# Patient Record
Sex: Male | Born: 1955 | Race: Black or African American | Hispanic: No | Marital: Married | Smoking: Never smoker
Health system: Southern US, Community
[De-identification: ages and names within clinical notes are randomized; demographics above are authoritative.]

## PROBLEM LIST (undated history)

## (undated) DIAGNOSIS — I10 Essential (primary) hypertension: Secondary | ICD-10-CM

## (undated) DIAGNOSIS — I2699 Other pulmonary embolism without acute cor pulmonale: Secondary | ICD-10-CM

## (undated) DIAGNOSIS — I314 Cardiac tamponade: Secondary | ICD-10-CM

## (undated) DIAGNOSIS — E119 Type 2 diabetes mellitus without complications: Secondary | ICD-10-CM

## (undated) HISTORY — PX: ACHILLES TENDON SURGERY: SHX542

## (undated) HISTORY — PX: ACHILLES TENDON REPAIR: SUR1153

---

## 2018-06-24 ENCOUNTER — Emergency Department (HOSPITAL_COMMUNITY): Payer: PRIVATE HEALTH INSURANCE

## 2018-06-24 ENCOUNTER — Other Ambulatory Visit: Payer: Self-pay

## 2018-06-24 ENCOUNTER — Observation Stay (HOSPITAL_COMMUNITY)
Admission: EM | Admit: 2018-06-24 | Discharge: 2018-06-26 | Disposition: A | Discharge status: Home / Self Care | Payer: PRIVATE HEALTH INSURANCE | Attending: Family Medicine | Admitting: Family Medicine

## 2018-06-24 ENCOUNTER — Encounter (HOSPITAL_COMMUNITY): Payer: Self-pay

## 2018-06-24 DIAGNOSIS — Z79899 Other long term (current) drug therapy: Secondary | ICD-10-CM | POA: Insufficient documentation

## 2018-06-24 DIAGNOSIS — I4510 Unspecified right bundle-branch block: Secondary | ICD-10-CM | POA: Insufficient documentation

## 2018-06-24 DIAGNOSIS — E785 Hyperlipidemia, unspecified: Secondary | ICD-10-CM

## 2018-06-24 DIAGNOSIS — Z7984 Long term (current) use of oral hypoglycemic drugs: Secondary | ICD-10-CM | POA: Insufficient documentation

## 2018-06-24 DIAGNOSIS — Z7901 Long term (current) use of anticoagulants: Secondary | ICD-10-CM

## 2018-06-24 DIAGNOSIS — E041 Nontoxic single thyroid nodule: Secondary | ICD-10-CM | POA: Insufficient documentation

## 2018-06-24 DIAGNOSIS — I10 Essential (primary) hypertension: Secondary | ICD-10-CM

## 2018-06-24 DIAGNOSIS — I2699 Other pulmonary embolism without acute cor pulmonale: Secondary | ICD-10-CM | POA: Diagnosis not present

## 2018-06-24 DIAGNOSIS — R42 Dizziness and giddiness: Secondary | ICD-10-CM | POA: Insufficient documentation

## 2018-06-24 DIAGNOSIS — E119 Type 2 diabetes mellitus without complications: Secondary | ICD-10-CM | POA: Diagnosis not present

## 2018-06-24 DIAGNOSIS — R0602 Shortness of breath: Secondary | ICD-10-CM

## 2018-06-24 DIAGNOSIS — Z6835 Body mass index (BMI) 35.0-35.9, adult: Secondary | ICD-10-CM | POA: Insufficient documentation

## 2018-06-24 DIAGNOSIS — K7689 Other specified diseases of liver: Secondary | ICD-10-CM | POA: Insufficient documentation

## 2018-06-24 HISTORY — DX: Essential (primary) hypertension: I10

## 2018-06-24 HISTORY — DX: Type 2 diabetes mellitus without complications: E11.9

## 2018-06-24 LAB — BASIC METABOLIC PANEL
Anion gap: 7 (ref 5–15)
BUN: 13 mg/dL (ref 6–20)
CO2: 25 mmol/L (ref 22–32)
Calcium: 8.7 mg/dL — ABNORMAL LOW (ref 8.9–10.3)
Chloride: 104 mmol/L (ref 98–111)
Creatinine, Ser: 1.04 mg/dL (ref 0.61–1.24)
GFR calc Af Amer: >60 mL/min (ref >60)
GFR calc non Af Amer: >60 mL/min (ref >60)
Glucose, Bld: 132 mg/dL — ABNORMAL HIGH (ref 70–99)
Potassium: 3.8 mmol/L (ref 3.5–5.1)
Sodium: 136 mmol/L (ref 135–145)

## 2018-06-24 LAB — CBC
HCT: 44.1 % (ref 39.0–52.0)
Hemoglobin: 14.3 g/dL (ref 13.0–17.0)
MCH: 27.1 pg (ref 26.0–34.0)
MCHC: 32.4 g/dL (ref 30.0–36.0)
MCV: 83.5 fL (ref 80.0–100.0)
NRBC: 0 % (ref 0.0–0.2)
Platelets: 266 10*3/uL (ref 150–400)
RBC: 5.28 MIL/uL (ref 4.22–5.81)
RDW: 13.9 % (ref 11.5–15.5)
WBC: 4.2 10*3/uL (ref 4.0–10.5)

## 2018-06-24 LAB — D-DIMER, QUANTITATIVE: D-Dimer, Quant: 1.63 ug/mL-FEU — ABNORMAL HIGH (ref 0.00–0.50)

## 2018-06-24 LAB — POCT I-STAT TROPONIN I: Troponin i, poc: 0.01 ng/mL (ref 0.00–0.08)

## 2018-06-24 LAB — APTT: aPTT: 34 seconds (ref 24–36)

## 2018-06-24 LAB — PROTIME-INR
INR: 0.89
Prothrombin Time: 12 seconds (ref 11.4–15.2)

## 2018-06-24 MED ORDER — ONDANSETRON HCL 4 MG/2ML IJ SOLN: 4.0000 mg | Freq: Four times a day (QID) | INTRAMUSCULAR | Status: DC | PRN

## 2018-06-24 MED ORDER — LISINOPRIL 20 MG PO TABS: 40.0000 mg | ORAL_TABLET | Freq: Every day | ORAL | Status: DC

## 2018-06-24 MED ORDER — SODIUM CHLORIDE 0.9 % IV BOLUS
1000.0000 mL | Freq: Once | INTRAVENOUS | Status: AC
  Administered 2018-06-24: 1000 mL via INTRAVENOUS

## 2018-06-24 MED ORDER — ACETAMINOPHEN 650 MG RE SUPP: 650.0000 mg | Freq: Four times a day (QID) | RECTAL | Status: DC | PRN

## 2018-06-24 MED ORDER — IOPAMIDOL (ISOVUE-370) INJECTION 76%
INTRAVENOUS | Status: AC
  Filled 2018-06-24: qty 100

## 2018-06-24 MED ORDER — ATORVASTATIN CALCIUM 20 MG PO TABS: 20.0000 mg | ORAL_TABLET | Freq: Every day | ORAL | Status: DC

## 2018-06-24 MED ORDER — POLYETHYLENE GLYCOL 3350 17 G PO PACK: 17.0000 g | PACK | Freq: Every day | ORAL | Status: DC | PRN

## 2018-06-24 MED ORDER — ONDANSETRON HCL 4 MG PO TABS: 4.0000 mg | ORAL_TABLET | Freq: Four times a day (QID) | ORAL | Status: DC | PRN

## 2018-06-24 MED ORDER — AMLODIPINE BESYLATE 5 MG PO TABS
5.0000 mg | ORAL_TABLET | Freq: Every day | ORAL | Status: DC
  Administered 2018-06-25 – 2018-06-26 (×2): 5 mg via ORAL
  Filled 2018-06-24 (×2): qty 1

## 2018-06-24 MED ORDER — INSULIN ASPART 100 UNIT/ML ~~LOC~~ SOLN
0.0000 [IU] | Freq: Three times a day (TID) | SUBCUTANEOUS | Status: DC
  Administered 2018-06-25: 2 [IU] via SUBCUTANEOUS
  Administered 2018-06-26 (×2): 1 [IU] via SUBCUTANEOUS

## 2018-06-24 MED ORDER — ACETAMINOPHEN 325 MG PO TABS: 650.0000 mg | ORAL_TABLET | Freq: Four times a day (QID) | ORAL | Status: DC | PRN

## 2018-06-24 MED ORDER — HEPARIN (PORCINE) 25000 UT/250ML-% IV SOLN
2000.0000 [IU]/h | INTRAVENOUS | Status: DC
  Administered 2018-06-24 – 2018-06-26 (×4): 2000 [IU]/h via INTRAVENOUS
  Filled 2018-06-24 (×4): qty 250

## 2018-06-24 MED ORDER — IOPAMIDOL (ISOVUE-370) INJECTION 76%
100.0000 mL | Freq: Once | INTRAVENOUS | Status: AC | PRN
  Administered 2018-06-24: 100 mL via INTRAVENOUS

## 2018-06-24 MED ORDER — SODIUM CHLORIDE (PF) 0.9 % IJ SOLN
INTRAMUSCULAR | Status: AC
  Filled 2018-06-24: qty 50

## 2018-06-24 MED ORDER — HEPARIN BOLUS VIA INFUSION
3500.0000 [IU] | INTRAVENOUS | Status: AC
  Administered 2018-06-24: 3500 [IU] via INTRAVENOUS
  Filled 2018-06-24: qty 3500

## 2018-06-24 NOTE — ED Notes (Signed)
Has not taken BP meds since Saturday due to needed refill.

## 2018-06-24 NOTE — Progress Notes (Signed)
ANTICOAGULATION CONSULT NOTE - Initial Consult  Pharmacy Consult for IV heparin Indication: pulmonary embolism  No Known Allergies  Patient Measurements: Height: 6\' 4"  (193 cm) Weight: 290 lb (131.5 kg) IBW/kg (Calculated) : 86.8 Heparin Dosing Weight: 115.4 kg  Vital Signs: Temp: 98.1 F (36.7 C) (01/06 2049) Temp Source: Oral (01/06 2049) BP: 184/101 (01/06 2200) Pulse Rate: 81 (01/06 2200)  Labs: Recent Labs    06/24/18 1519 06/24/18 1646  HGB 14.3  --   HCT 44.1  --   PLT 266  --   APTT  --  34  LABPROT  --  12.0  INR  --  0.89  CREATININE 1.04  --     Estimated Creatinine Clearance: 111.9 mL/min (by C-G formula based on SCr of 1.04 mg/dL).   Medical History: Past Medical History:  Diagnosis Date  . Diabetes mellitus without complication (HCC)   . Hypertension      Assessment: 39 y/oM with PMH of DM, HTN, and recent diagnosis of PNA who presents to [facility] ED on 06/24/2018 with shortness of breath, occasional chest tightness, and lightheadedness. D-dimer 1.63. CTa of chest + for PE. Pharmacy consulted to dose heparin infusion. Patient not on any anticoagulants PTA. Baseline CBC WNL, PT/INR 12/0.89, aPTT 34 seconds.   Goal of Therapy:  Heparin level 0.3-0.7 units/ml Monitor platelets by anticoagulation protocol: Yes   Plan:   Heparin 3500 units IV bolus x 1, then start heparin infusion at 2000 units/hr  Heparin level 6 hours after infusion initiated  Daily CBC and heparin level  Monitor closely for s/sx of bleeding   Greer Pickerel, PharmD, BCPS Pager: [phone] 06/24/2018 10:12 PM

## 2018-06-24 NOTE — ED Provider Notes (Signed)
[facility]-EMERGENCY DEPT Provider Note   CSN: [id number] Arrival date & time: 06/24/18  1421     History   Chief Complaint Chief Complaint  Patient presents with  . Chest Pain    HPI Steven Tapia is a 63 y.o. male.  The history is provided by the patient and medical records. No language interpreter was used.  Shortness of Breath  Severity:  Severe Onset quality:  Gradual Duration:  2 weeks Timing:  Constant Progression:  Waxing and waning Chronicity:  New Relieved by:  Nothing Worsened by:  Nothing Ineffective treatments: antibiotics. Associated symptoms: chest pain (intermittent)   Associated symptoms: no abdominal pain, no cough, no diaphoresis, no fever, no headaches, no neck pain, no sore throat, no vomiting and no wheezing   Risk factors: no hx of PE/DVT   Risk factors comment:  Works as a Naval architecttruck driver   Past Medical History:  Diagnosis Date  . Diabetes mellitus without complication (HCC)   . Hypertension     There are no active problems to display for this patient.   Past Surgical History:  Procedure Laterality Date  . ACHILLES TENDON REPAIR          Home Medications    Prior to Admission medications   Not on File    Family History Family History  Problem Relation Age of Onset  . Alzheimer's disease Mother     Social History Social History   Tobacco Use  . Smoking status: Never Smoker  . Smokeless tobacco: Never Used  Substance Use Topics  . Alcohol use: Never    Frequency: Never  . Drug use: Never     Allergies   Patient has no known allergies.   Review of Systems Review of Systems  Constitutional: Negative for chills, diaphoresis, fatigue and fever.  HENT: Negative for congestion and sore throat.   Respiratory: Positive for chest tightness and shortness of breath. Negative for cough, wheezing and stridor.   Cardiovascular: Positive for chest pain (intermittent). Negative for palpitations and leg  swelling.  Gastrointestinal: Negative for abdominal pain, constipation, diarrhea, nausea and vomiting.  Genitourinary: Negative for flank pain.  Musculoskeletal: Negative for back pain, neck pain and neck stiffness.  Neurological: Negative for light-headedness and headaches.  Psychiatric/Behavioral: Negative for agitation.  All other systems reviewed and are negative.    Physical Exam Updated Vital Signs BP (!) 182/108   Pulse (!) 108   Temp 98.5 F (36.9 C) (Oral)   Resp 20   Ht 6\' 4"  (1.93 m)   Wt 131.5 kg   SpO2 99%   BMI 35.30 kg/m   Physical Exam Vitals signs and nursing note reviewed.  Constitutional:      General: He is not in acute distress.    Appearance: He is well-developed. He is not ill-appearing, toxic-appearing or diaphoretic.  HENT:     Head: Normocephalic and atraumatic.  Eyes:     Conjunctiva/sclera: Conjunctivae normal.     Pupils: Pupils are equal, round, and reactive to light.  Neck:     Musculoskeletal: Neck supple.  Cardiovascular:     Rate and Rhythm: Regular rhythm. Tachycardia present.     Heart sounds: No murmur. No systolic murmur.  Pulmonary:     Effort: Pulmonary effort is normal. Tachypnea present. No respiratory distress.     Breath sounds: Normal breath sounds.  Abdominal:     Palpations: Abdomen is soft.     Tenderness: There is no abdominal tenderness.  Musculoskeletal:  Right lower leg: He exhibits no tenderness. No edema.     Left lower leg: He exhibits no tenderness. No edema.  Skin:    General: Skin is warm and dry.     Capillary Refill: Capillary refill takes less than 2 seconds.  Neurological:     General: No focal deficit present.     Mental Status: He is alert.      ED Treatments / Results  Labs (all labs ordered are listed, but only abnormal results are displayed) Labs Reviewed  BASIC METABOLIC PANEL - Abnormal; Notable for the following components:      Result Value   Glucose, Bld 132 (*)    Calcium 8.7  (*)    All other components within normal limits  D-DIMER, QUANTITATIVE (NOT AT [facility]) - Abnormal; Notable for the following components:   D-Dimer, Quant 1.63 (*)    All other components within normal limits  CBC  PROTIME-INR  APTT  HEPARIN LEVEL (UNFRACTIONATED)  CBC  HIV ANTIBODY (ROUTINE TESTING W REFLEX)  HEMOGLOBIN A1C  I-STAT TROPONIN, ED  POCT I-STAT TROPONIN I    EKG EKG Interpretation  Date/Time:  Monday June 24 2018 14:38:58 EST Ventricular Rate:  107 PR Interval:    QRS Duration: 135 QT Interval:  343 QTC Calculation: 458 R Axis:   41 Text Interpretation:  Sinus tachycardia Probable left atrial enlargement Right bundle branch block Lateral leads are also involved Baseline wander in lead(s) I III aVL No prior ECG for comparison, RBBB present with t wave inversions.  No STEMI Confirmed by Theda Belfast ([id number]) on 06/24/2018 4:40:36 PM   Radiology Dg Chest 2 View  Result Date: 06/24/2018 CLINICAL DATA:  63 y.o male states he was seen in an ED in [facility] on 12/24 and was told he had pneumonia and was given oral antibiotics. Patient states he began having intermittent chest pain, intermittent SOB EXAM: CHEST - 2 VIEW COMPARISON:  None. FINDINGS: There is hazy right basilar airspace disease which may reflect atelectasis versus pneumonia. There is no focal parenchymal opacity. There is no pleural effusion or pneumothorax. The heart size is top-normal. The osseous structures are unremarkable. IMPRESSION: Hazy right basilar airspace disease which may reflect atelectasis versus pneumonia. Electronically Signed   By: Elige Ko   On: 06/24/2018 16:02   Ct Angio Chest Pe W And/or Wo Contrast  Result Date: 06/24/2018 CLINICAL DATA:  PE suspected, intermediate prob, positive D-dimer. Patient reports being diagnosed with pneumonia at an outside facility 2 weeks ago. Onset of chest pain and shortness of breath last night. EXAM: CT ANGIOGRAPHY CHEST WITH CONTRAST TECHNIQUE: Multidetector CT  imaging of the chest was performed using the standard protocol during bolus administration of intravenous contrast. Multiplanar CT image reconstructions and MIPs were obtained to evaluate the vascular anatomy. CONTRAST:  ISOVUE-370 IOPAMIDOL (ISOVUE-370) INJECTION 76% COMPARISON:  Radiographs earlier this day. FINDINGS: Cardiovascular: Positive for acute pulmonary embolus with filling defects in the segmental branches of the right lower lobe. Possible additional subsegmental filling defects in the left lower lobe, not well assessed due to contrast bolus timing and soft tissue attenuation from habitus. There is multi chamber cardiomegaly with RV to LV ratio of 1. The thoracic aorta is normal in caliber. No aortic dissection. No pericardial effusion. Mediastinum/Nodes: Small mediastinal lymph nodes are not enlarged by size criteria. The esophagus is decompressed. Mild thyroid enlargement with probable 2.4 cm nodule in the left lobe, partially obscured by dense streak artifact in the subclavian vein. Lungs/Pleura:  Minor dependent atelectasis, right greater than left. No evidence of pulmonary infarct. No pulmonary edema or pleural effusion. Trachea and mainstem bronchi are patent. Upper Abdomen: Incidental cyst in the periphery of the left lobe of the liver. No acute or suspicious findings. Musculoskeletal: Mild degenerative change in the thoracic spine. There are no acute or suspicious osseous abnormalities. Review of the MIP images confirms the above findings. IMPRESSION: 1. Positive for acute pulmonary embolus with filling defects in the segmental branches of the right lower lobe pulmonary artery, and possibly subsegmental left lower lobe. Thromboembolic burden is small to moderate. 2. Multi chamber cardiomegaly. While the RV to LV ratio is 1 in suggest presence of mild right heart strain, do not recommend Code PE activation given emboli involve the segmental rather than lobar or more proximal branches. 3. **An  incidental finding of potential clinical significance has been found. Probable 2.4 cm left thyroid nodule. Recommend nonemergent thyroid ultrasound for characterization.** Critical Value/emergent results were called by telephone at the time of interpretation on 06/24/2018 at 8:55 pm to Dr. Lynden OxfordHRISTOPHER Tarnesha Ulloa , who verbally acknowledged these results. Electronically Signed   By: Narda RutherfordMelanie  Sanford M.D.   On: 06/24/2018 20:56    Procedures Procedures (including critical care time)  CRITICAL CARE Performed by: Canary Brimhristopher J Barack Nicodemus Total critical care time: 35 minutes Critical care time was exclusive of separately billable procedures and treating other patients. Pulmonary embolism with heart strain requiring heparin and admission. Critical care was necessary to treat or prevent imminent or life-threatening deterioration. Critical care was time spent personally by me on the following activities: development of treatment plan with patient and/or surrogate as well as nursing, discussions with consultants, evaluation of patient's response to treatment, examination of patient, obtaining history from patient or surrogate, ordering and performing treatments and interventions, ordering and review of laboratory studies, ordering and review of radiographic studies, pulse oximetry and re-evaluation of patient's condition.   Medications Ordered in ED Medications  sodium chloride (PF) 0.9 % injection (has no administration in time range)  iopamidol (ISOVUE-370) 76 % injection (has no administration in time range)  heparin bolus via infusion 3,500 Units (3,500 Units Intravenous Bolus from Bag 06/24/18 2154)    Followed by  heparin ADULT infusion 100 units/mL (25000 units/21450mL sodium chloride 0.45%) (2,000 Units/hr Intravenous New Bag/Given 06/24/18 2156)  lisinopril (PRINIVIL,ZESTRIL) tablet 40 mg (has no administration in time range)  atorvastatin (LIPITOR) tablet 20 mg (has no administration in time range)    amLODipine (NORVASC) tablet 5 mg (has no administration in time range)  acetaminophen (TYLENOL) tablet 650 mg (has no administration in time range)    Or  acetaminophen (TYLENOL) suppository 650 mg (has no administration in time range)  polyethylene glycol (MIRALAX / GLYCOLAX) packet 17 g (has no administration in time range)  ondansetron (ZOFRAN) tablet 4 mg (has no administration in time range)    Or  ondansetron (ZOFRAN) injection 4 mg (has no administration in time range)  insulin aspart (novoLOG) injection 0-9 Units (has no administration in time range)  hydrALAZINE (APRESOLINE) injection 10 mg (has no administration in time range)  sodium chloride 0.9 % bolus 1,000 mL (1,000 mLs Intravenous New Bag/Given (Non-Interop) 06/24/18 1647)  iopamidol (ISOVUE-370) 76 % injection 100 mL (100 mLs Intravenous Contrast Given 06/24/18 2043)     Initial Impression / Assessment and Plan / ED Course  I have reviewed the triage vital signs and the nursing notes.  Pertinent labs & imaging results that were available during my care  of the patient were reviewed by me and considered in my medical decision making (see chart for details).     Steven Tapia is a 63 y.o. male with a past medical history significant for hypertension diabetes as well as recent diagnosis of pneumonia currently on antibiotics who presents with continued shortness of breath and lightheadedness.  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 and says that they were Christmas, approximately 2 weeks ago, he had shortness of breath.  He was told that x-ray showed pneumonia despite lack of fevers, chills, congestion, or significant cough.  He has been taken the antibiotics and has had worsening of his shortness of breath.  He denies chest pressure but does report occasional chest tightness.  No significant chest pain at this time.  He does report feeling lightheaded and fatigued but  denies any nausea, vomiting, diaphoresis, urinary symptoms or GI symptoms.  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  He denies other complaints.  No recent injuries.  On arrival, patient is tachycardic with a rate of 108.  Patient is slightly tachypneic and has elevated blood pressure.  Patient is on room air resting comfortably however.  On exam, patient's lungs had faint rhonchi in the bases but otherwise had no significant abnormalities.  Chest and back were nontender.  No flank tenderness.  Legs were nonedematous and nontender.  No focal neurologic deficit seen on initial exam.  Radial pulses were symmetric bilaterally.  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  Patient will have a d-dimer to rule this out.  He will have a repeat chest x-ray to look for continued pneumonia and other lab testing and cardiac work-up.  Anticipate reassessment after work-up.  6:09 PM Heart rate began to improve with some fluids.  Initial troponin was negative.  D-dimer was however elevated at 1.63, CT PE study will be ordered.  No leukocytosis or anemia.  Metabolic panel reassuring aside from mild hypocalcemia.  Chest x-ray shows hazy atelectasis versus mild pneumonia.  CT scan will help determine pulmonary embolism versus actual residual pneumonia.  Patient resting comfortably at this time.   9:17 PM PE study revealed pulmonary embolism in the segmental branches with some right heart strain.  Given the right heart strain, patient will be admitted for heparin and further monitoring.   Heparin will be ordered and patient will be admitted for further management.    Final Clinical Impressions(s) / ED Diagnoses   Final diagnoses:  Shortness  of breath  Lightheadedness  Other acute pulmonary embolism, unspecified whether acute cor pulmonale present [facility])    ED Discharge Orders    None     Clinical Impression: 1. Shortness of breath   2. Lightheadedness   3. Other acute pulmonary embolism, unspecified whether acute cor pulmonale present (HCC)     Disposition: Admit  This note was prepared with assistance of Dragon voice recognition software. Occasional wrong-word or sound-a-like substitutions may have occurred due to the inherent limitations of voice recognition software.     Markan Cazarez, Canary Brim, MD 06/25/18 [phone]

## 2018-06-24 NOTE — Progress Notes (Signed)
ED TO INPATIENT HANDOFF REPORT  Name/Age/Gender Steven Tapia 63 y.o. male  Code Status   Home/SNF/Other Home  Chief Complaint Chest Pain  Level of Care/Admitting Diagnosis ED Disposition    ED Disposition Condition Comment   Admit  Hospital Area: [facility] [[id number]]  Level of Care: Telemetry [5]  Admit to tele based on following criteria: Other see comments  Comments: PE  Diagnosis: PE (pulmonary thromboembolism) [facility]) [703500]  Admitting Physician: Synetta Fail [[id number]]  Attending Physician: Synetta Fail [[id number]]  PT Class (Do Not Modify): Observation [104]  PT Acc Code (Do Not Modify): Observation [10022]       Medical History Past Medical History:  Diagnosis Date  . Diabetes mellitus without complication (HCC)   . Hypertension     Allergies No Known Allergies  IV Location/Drains/Wounds Patient Lines/Drains/Airways Status   Active Line/Drains/Airways    Name:   Placement date:   Placement time:   Site:   Days:   Peripheral IV 06/24/18 Left Antecubital   06/24/18    1646    Antecubital   less than 1          Labs/Imaging Results for orders placed or performed during the hospital encounter of 06/24/18 (from the past 48 hour(s))  Basic metabolic panel     Status: Abnormal   Collection Time: 06/24/18  3:19 PM  Result Value Ref Range   Sodium 136 135 - 145 mmol/L   Potassium 3.8 3.5 - 5.1 mmol/L   Chloride 104 98 - 111 mmol/L   CO2 25 22 - 32 mmol/L   Glucose, Bld 132 (H) 70 - 99 mg/dL   BUN 13 6 - 20 mg/dL   Creatinine, Ser 7.89 0.61 - 1.24 mg/dL   Calcium 8.7 (L) 8.9 - 10.3 mg/dL   GFR calc non Af Amer >60 >60 mL/min   GFR calc Af Amer >60 >60 mL/min   Anion gap 7 5 - 15    Comment: Performed at [facility], [address], [place], [place] [address]  CBC     Status: None   Collection Time: 06/24/18  3:19 PM  Result Value Ref Range   WBC 4.2 4.0 - 10.5 K/uL   RBC 5.28 4.22 - 5.81 MIL/uL    Hemoglobin 14.3 13.0 - 17.0 g/dL   HCT 75.1 02.5 - 85.2 %   MCV 83.5 80.0 - 100.0 fL   MCH 27.1 26.0 - 34.0 pg   MCHC 32.4 30.0 - 36.0 g/dL   RDW 77.8 24.2 - 35.3 %   Platelets 266 150 - 400 K/uL   nRBC 0.0 0.0 - 0.2 %    Comment: Performed at [facility], [address], [place], [place] [address]  POCT i-Stat troponin I     Status: None   Collection Time: 06/24/18  3:26 PM  Result Value Ref Range   Troponin i, poc 0.01 0.00 - 0.08 ng/mL   Comment 3            Comment: Due to the release kinetics of cTnI, a negative result within the first hours of the onset of symptoms does not rule out myocardial infarction with certainty. If myocardial infarction is still suspected, repeat the test at appropriate intervals.   D-dimer, quantitative (not at [facility])     Status: Abnormal   Collection Time: 06/24/18  4:46 PM  Result Value Ref Range   D-Dimer, Quant 1.63 (H) 0.00 - 0.50 ug/mL-FEU    Comment: (NOTE)  At the manufacturer cut-off of 0.50 ug/mL FEU, this assay has been documented to exclude PE with a sensitivity and negative predictive value of 97 to 99%.  At this time, this assay has not been approved by the FDA to exclude DVT/VTE. Results should be correlated with clinical presentation. Performed at [facility], [address] [place], [place] [address]   Protime-INR     Status: None   Collection Time: 06/24/18  4:46 PM  Result Value Ref Range   Prothrombin Time 12.0 11.4 - 15.2 seconds   INR 0.89     Comment: Performed at [facility], [address] [place], [place] [address]  APTT     Status: None   Collection Time: 06/24/18  4:46 PM  Result Value Ref Range   aPTT 34 24 - 36 seconds    Comment: Performed at [facility], [address] [place], [place] [address]   Dg Chest 2 View  Result Date: 06/24/2018 CLINICAL DATA:  63 y.o male states [facility] and was told he  had pneumonia and was given oral antibiotics. Patient states he began having intermittent chest pain, intermittent SOB EXAM: CHEST - 2 VIEW COMPARISON:  None. FINDINGS: There is hazy right basilar airspace disease which may reflect atelectasis versus pneumonia. There is no focal parenchymal opacity. There is no pleural effusion or pneumothorax. The heart size is top-normal. The osseous structures are unremarkable. IMPRESSION: Hazy right basilar airspace disease which may reflect atelectasis versus pneumonia. Electronically Signed   By: Elige KoHetal  Patel   On: 06/24/2018 16:02   Ct Angio Chest Pe W And/or Wo Contrast  Result Date: 06/24/2018 CLINICAL DATA:  PE suspected, intermediate prob, positive D-dimer. Patient reports being diagnosed with pneumonia at an outside facility 2 weeks ago. Onset of chest pain and shortness of breath last night. EXAM: CT ANGIOGRAPHY CHEST WITH CONTRAST TECHNIQUE: Multidetector CT imaging of the chest was performed using the standard protocol during bolus administration of intravenous contrast. Multiplanar CT image reconstructions and MIPs were obtained to evaluate the vascular anatomy. CONTRAST:  100mL ISOVUE-370 IOPAMIDOL (ISOVUE-370) INJECTION 76% COMPARISON:  Radiographs earlier this day. FINDINGS: Cardiovascular: Positive for acute pulmonary embolus with filling defects in the segmental branches of the right lower lobe. Possible additional subsegmental filling defects in the left lower lobe, not well assessed due to contrast bolus timing and soft tissue attenuation from habitus. There is multi chamber cardiomegaly with RV to LV ratio of 1. The thoracic aorta is normal in caliber. No aortic dissection. No pericardial effusion. Mediastinum/Nodes: Small mediastinal lymph nodes are not enlarged by size criteria. The esophagus is decompressed. Mild thyroid enlargement with probable 2.4 cm nodule in the left lobe, partially obscured by dense streak artifact in the subclavian vein.  Lungs/Pleura: Minor dependent atelectasis, right greater than left. No evidence of pulmonary infarct. No pulmonary edema or pleural effusion. Trachea and mainstem bronchi are patent. Upper Abdomen: Incidental cyst in the periphery of the left lobe of the liver. No acute or suspicious findings. Musculoskeletal: Mild degenerative change in the thoracic spine. There are no acute or suspicious osseous abnormalities. Review of the MIP images confirms the above findings. IMPRESSION: 1. Positive for acute pulmonary embolus with filling defects in the segmental branches of the right lower lobe pulmonary artery, and possibly subsegmental left lower lobe. Thromboembolic burden is small to moderate. 2. Multi chamber cardiomegaly. While the RV to LV ratio is 1 in  suggest presence of mild right heart strain, do not recommend Code PE activation given emboli involve the segmental rather than lobar or more proximal branches. 3. **An incidental finding of potential clinical significance has been found. Probable 2.4 cm left thyroid nodule. Recommend nonemergent thyroid ultrasound for characterization.** Critical Value/emergent results were called by telephone at the time of interpretation on 06/24/2018 at 8:55 pm to Dr. Lynden Oxford , who verbally acknowledged these results. Electronically Signed   By: Narda Rutherford M.D.   On: 06/24/2018 20:56    Pending Labs Unresulted Labs (From admission, onward)    Start     Ordered   06/26/18 0500  Heparin level (unfractionated)  Daily,   R     06/24/18 2216   06/25/18 0500  CBC  Daily,   R     06/24/18 2216   06/25/18 0400  Heparin level (unfractionated)  Once-Timed,   R     06/24/18 2216   Signed and Held  HIV antibody (Routine Testing)  Once,   R     Signed and Held   Signed and Held  Hemoglobin A1c  Once,   R    Comments:  To assess prior glycemic control    Signed and Held          Vitals/Pain Today's Vitals   06/24/18 2049 06/24/18 2100 06/24/18 2130  06/24/18 2200  BP: (!) 178/98 (!) 182/98 (!) 185/96 (!) 184/101  Pulse: 82 76 81 81  Resp: (!) 24 (!) 25 16 (!) 30  Temp: 98.1 F (36.7 C)     TempSrc: Oral     SpO2: 99% 97% 98% 98%  Weight:      Height:      PainSc:        Isolation Precautions No active isolations  Medications Medications  sodium chloride (PF) 0.9 % injection (has no administration in time range)  iopamidol (ISOVUE-370) 76 % injection (has no administration in time range)  heparin bolus via infusion 3,500 Units (3,500 Units Intravenous Bolus from Bag 06/24/18 2154)    Followed by  heparin ADULT infusion 100 units/mL (25000 units/289mL sodium chloride 0.45%) (2,000 Units/hr Intravenous New Bag/Given 06/24/18 2156)  sodium chloride 0.9 % bolus 1,000 mL (1,000 mLs Intravenous New Bag/Given (Non-Interop) 06/24/18 1647)  iopamidol (ISOVUE-370) 76 % injection 100 mL (100 mLs Intravenous Contrast Given 06/24/18 2043)    Mobility walks

## 2018-06-24 NOTE — ED Triage Notes (Addendum)
Patient states he was seen in an ED in [facility] on 12/24 and was told he had pneumonia and was given oral antibiotics. Patient states he began having intermittent chest pain, intermittent SOB, and feeling light headed since last night. Patient denies having a cough.

## 2018-06-25 ENCOUNTER — Observation Stay (HOSPITAL_BASED_OUTPATIENT_CLINIC_OR_DEPARTMENT_OTHER): Payer: PRIVATE HEALTH INSURANCE

## 2018-06-25 ENCOUNTER — Encounter (HOSPITAL_COMMUNITY): Payer: Self-pay | Admitting: Internal Medicine

## 2018-06-25 DIAGNOSIS — I2699 Other pulmonary embolism without acute cor pulmonale: Secondary | ICD-10-CM

## 2018-06-25 LAB — GLUCOSE, CAPILLARY
Glucose-Capillary: 107 mg/dL — ABNORMAL HIGH (ref 70–99)
Glucose-Capillary: 175 mg/dL — ABNORMAL HIGH (ref 70–99)
Glucose-Capillary: 117 mg/dL — ABNORMAL HIGH (ref 70–99)
Glucose-Capillary: 133 mg/dL — ABNORMAL HIGH (ref 70–99)

## 2018-06-25 LAB — ECHOCARDIOGRAM COMPLETE
Height: 76 in
Weight: 4640 oz

## 2018-06-25 LAB — CBC
HCT: 41.9 % (ref 39.0–52.0)
Hemoglobin: 13.6 g/dL (ref 13.0–17.0)
MCH: 27.1 pg (ref 26.0–34.0)
MCHC: 32.5 g/dL (ref 30.0–36.0)
MCV: 83.5 fL (ref 80.0–100.0)
Platelets: 247 10*3/uL (ref 150–400)
RBC: 5.02 MIL/uL (ref 4.22–5.81)
RDW: 14.1 % (ref 11.5–15.5)
WBC: 4.9 10*3/uL (ref 4.0–10.5)
nRBC: 0 % (ref 0.0–0.2)

## 2018-06-25 LAB — HEPARIN LEVEL (UNFRACTIONATED)
Heparin Unfractionated: 0.39 IU/mL (ref 0.30–0.70)
Heparin Unfractionated: 0.4 [IU]/mL (ref 0.30–0.70)

## 2018-06-25 LAB — HIV ANTIBODY (ROUTINE TESTING W REFLEX): HIV Screen 4th Generation wRfx: NONREACTIVE

## 2018-06-25 LAB — HEMOGLOBIN A1C
Hgb A1c MFr Bld: 6.5 % — ABNORMAL HIGH (ref 4.8–5.6)
Mean Plasma Glucose: 139.85 mg/dL

## 2018-06-25 MED ORDER — ATORVASTATIN CALCIUM 20 MG PO TABS
20.0000 mg | ORAL_TABLET | Freq: Every day | ORAL | Status: DC
  Administered 2018-06-25: 20 mg via ORAL
  Filled 2018-06-25: qty 1

## 2018-06-25 MED ORDER — HYDRALAZINE HCL 20 MG/ML IJ SOLN
10.0000 mg | Freq: Once | INTRAMUSCULAR | Status: AC
  Administered 2018-06-25: 10 mg via INTRAVENOUS
  Filled 2018-06-25: qty 1

## 2018-06-25 MED ORDER — HYDROCHLOROTHIAZIDE 25 MG PO TABS
25.0000 mg | ORAL_TABLET | Freq: Every day | ORAL | Status: DC
  Administered 2018-06-25 – 2018-06-26 (×2): 25 mg via ORAL
  Filled 2018-06-25 (×2): qty 1

## 2018-06-25 MED ORDER — LISINOPRIL 20 MG PO TABS
40.0000 mg | ORAL_TABLET | Freq: Every day | ORAL | Status: DC
  Administered 2018-06-25 – 2018-06-26 (×2): 40 mg via ORAL
  Filled 2018-06-25 (×2): qty 2

## 2018-06-25 MED ORDER — ORAL CARE MOUTH RINSE
15.0000 mL | Freq: Two times a day (BID) | OROMUCOSAL | Status: DC
  Administered 2018-06-26: 15 mL via OROMUCOSAL

## 2018-06-25 MED ORDER — CHLORHEXIDINE GLUCONATE 0.12 % MT SOLN
15.0000 mL | Freq: Two times a day (BID) | OROMUCOSAL | Status: DC
  Administered 2018-06-25 – 2018-06-26 (×3): 15 mL via OROMUCOSAL
  Filled 2018-06-25 (×4): qty 15

## 2018-06-25 MED ORDER — PERFLUTREN LIPID MICROSPHERE
1.0000 mL | INTRAVENOUS | Status: AC | PRN
  Administered 2018-06-25: 3 mL via INTRAVENOUS
  Filled 2018-06-25: qty 10

## 2018-06-25 NOTE — Progress Notes (Signed)
Pharmacy: Re- heparin  Patient's a 63 y.o M currently on heparin drip for acute PE.  First heparin level now (goal 0.3-0.7) therapeutic at 0.39.  Plan: - contninue heparin drip at 2000 units/hr - recheck another 6 hr heparin level to confirm level is still therapeutic before changing to daily monitoring - monitor for s/s bleeding  Dorna Leitz, PharmD, BCPS 06/25/2018 4:57 AM

## 2018-06-25 NOTE — H&P (Addendum)
H&P        History and Physical    Steven Tapia JOI:[id number] DOB: September 02, 1955 DOA: 06/24/2018  PCP: System, Pcp Not In  Patient coming from: home  I have personally briefly reviewed patient's old medical records in [facility]  Chief Complaint: chest tightness  HPI: Steven Tapia is a 63 y.o. male with medical history significant of Dm, Htn presents with chest tightness.  Patient was diagnosed with pneumonia in December at a facility in [place].  He completed his antibiotics for 10 days.  He continued to have chest tightness which prompted him to come to the ED today.  Denies any leg pain or swelling.  He denies any personal history of heart, lung or blood clot history.  His father did have blood clots in his elderly years.  Denies any shortness of breath or fever.  He has diabetes maintained with metformin.  Also has hypertension hyperlipidemia.  He is a Agricultural consultant.   ED Course: Patient had a mildly elevated d-dimer with a subsequent CTA chest that showed pulmonary embolism with mild  right heart strain.  Patient was quite Hypertensive here.  He however has not taken his night medications.  Patient was started on heparin drip and given a dose of IV hydralazine for his blood pressure.  Review of Systems: Denies shortness of breath fever leg pain or swelling  All others reviewed with patient  and are  negative unless otherwise stated that  Past Medical History:  Diagnosis Date  . Diabetes mellitus without complication (HCC)   . Hypertension Hyperlipidemia     Past Surgical History:  Procedure Laterality Date  . ACHILLES TENDON REPAIR    . ACHILLES TENDON SURGERY       reports that he has never smoked. He has never used smokeless tobacco. He reports that he does not drink alcohol or use drugs.  No Known Allergies  Family History  Problem Relation Age of Onset  . Alzheimer's disease Mother   . Clotting disorder Father   . Diabetes Sister     Prior  to Admission medications   Medication Sig Start Date End Date Taking? Authorizing Provider  amLODipine (NORVASC) 5 MG tablet Take 5 mg by mouth daily.   Yes [provider]  atorvastatin (LIPITOR) 20 MG tablet Take 20 mg by mouth daily.   Yes [provider]  lisinopril (PRINIVIL,ZESTRIL) 40 MG tablet Take 40 mg by mouth daily.   Yes [provider]  metFORMIN (GLUCOPHAGE) 500 MG tablet Take 500 mg by mouth 2 (two) times daily with a meal.   Yes [provider]    Physical Exam: Vitals:   06/24/18 2200 06/24/18 2230 06/24/18 2329 06/25/18 0115  BP: (!) 184/101 (!) 190/106 (!) 201/103 (!) 192/92  Pulse: 81 82 82   Resp: (!) 30 (!) 27 20   Temp:   97.7 F (36.5 C)   TempSrc:   Oral   SpO2: 98% 97% 97%   Weight:      Height:        Constitutional: NAD, calm, comfortable Vitals:   06/24/18 2200 06/24/18 2230 06/24/18 2329 06/25/18 0115  BP: (!) 184/101 (!) 190/106 (!) 201/103 (!) 192/92  Pulse: 81 82 82   Resp: (!) 30 (!) 27 20   Temp:   97.7 F (36.5 C)   TempSrc:   Oral   SpO2: 98% 97% 97%   Weight:      Height:  Eyes: PERRL, lids and conjunctivae normal ENMT: Mucous membranes are moist. Posterior pharynx clear of any exudate or lesions.Normal dentition.  Neck: normal, supple,  Respiratory: clear to auscultation bilaterally, no wheezing, no crackles. Normal respiratory effort. No accessory muscle use.  Cardiovascular: Regular rate and rhythm, +gallops. No extremity edema. 2+ pedal pulses.   Abdomen: no tenderness, no masses palpated.Bowel sounds positive.  Musculoskeletal: no clubbing / cyanosis.  Skin: no rashes, lesions, ulcers. No induration Neurologic: CN 2-12 grossly intact. Strength 5/5 in all 4.  Psychiatric: Normal judgment and insight. Alert and oriented x 3. Normal mood.    Labs on Admission: I have personally reviewed following labs and imaging studies  CBC: Recent Labs  Lab 06/24/18 1519  WBC 4.2  HGB 14.3  HCT  44.1  MCV 83.5  PLT 266   Basic Metabolic Panel: Recent Labs  Lab 06/24/18 1519  NA 136  K 3.8  CL 104  CO2 25  GLUCOSE 132*  BUN 13  CREATININE 1.04  CALCIUM 8.7*   GFR: Estimated Creatinine Clearance: 111.9 mL/min (by C-G formula based on SCr of 1.04 mg/dL). Liver Function Tests: No results for input(s): AST, ALT, ALKPHOS, BILITOT, PROT, ALBUMIN in the last 168 hours. No results for input(s): LIPASE, AMYLASE in the last 168 hours. No results for input(s): AMMONIA in the last 168 hours. Coagulation Profile: Recent Labs  Lab 06/24/18 1646  INR 0.89   Cardiac Enzymes: No results for input(s): CKTOTAL, CKMB, CKMBINDEX, TROPONINI in the last 168 hours. BNP (last 3 results) No results for input(s): PROBNP in the last 8760 hours. HbA1C: No results for input(s): HGBA1C in the last 72 hours. CBG: No results for input(s): GLUCAP in the last 168 hours. Lipid Profile: No results for input(s): CHOL, HDL, LDLCALC, TRIG, CHOLHDL, LDLDIRECT in the last 72 hours. Thyroid Function Tests: No results for input(s): TSH, T4TOTAL, FREET4, T3FREE, THYROIDAB in the last 72 hours. Anemia Panel: No results for input(s): VITAMINB12, FOLATE, FERRITIN, TIBC, IRON, RETICCTPCT in the last 72 hours. Urine analysis: No results found for: COLORURINE, APPEARANCEUR, LABSPEC, PHURINE, GLUCOSEU, HGBUR, BILIRUBINUR, KETONESUR, PROTEINUR, UROBILINOGEN, NITRITE, LEUKOCYTESUR  Radiological Exams on Admission: Dg Chest 2 View  Result Date: 06/24/2018 CLINICAL DATA:  63 y.o male states [facility] and was told he had pneumonia and was given oral antibiotics. Patient states he began having intermittent chest pain, intermittent SOB EXAM: CHEST - 2 VIEW COMPARISON:  None. FINDINGS: There is hazy right basilar airspace disease which may reflect atelectasis versus pneumonia. There is no focal parenchymal opacity. There is no pleural effusion or pneumothorax. The heart size is top-normal. The  osseous structures are unremarkable. IMPRESSION: Hazy right basilar airspace disease which may reflect atelectasis versus pneumonia. Electronically Signed   By: Elige KoHetal  Patel   On: 06/24/2018 16:02   Ct Angio Chest Pe W And/or Wo Contrast  Result Date: 06/24/2018 CLINICAL DATA:  PE suspected, intermediate prob, positive D-dimer. Patient reports being diagnosed with pneumonia at an outside facility 2 weeks ago. Onset of chest pain and shortness of breath last night. EXAM: CT ANGIOGRAPHY CHEST WITH CONTRAST TECHNIQUE: Multidetector CT imaging of the chest was performed using the standard protocol during bolus administration of intravenous contrast. Multiplanar CT image reconstructions and MIPs were obtained to evaluate the vascular anatomy. CONTRAST:  100mL ISOVUE-370 IOPAMIDOL (ISOVUE-370) INJECTION 76% COMPARISON:  Radiographs earlier this day. FINDINGS: Cardiovascular: Positive for acute pulmonary embolus with filling defects in the segmental branches of the  right lower lobe. Possible additional subsegmental filling defects in the left lower lobe, not well assessed due to contrast bolus timing and soft tissue attenuation from habitus. There is multi chamber cardiomegaly with RV to LV ratio of 1. The thoracic aorta is normal in caliber. No aortic dissection. No pericardial effusion. Mediastinum/Nodes: Small mediastinal lymph nodes are not enlarged by size criteria. The esophagus is decompressed. Mild thyroid enlargement with probable 2.4 cm nodule in the left lobe, partially obscured by dense streak artifact in the subclavian vein. Lungs/Pleura: Minor dependent atelectasis, right greater than left. No evidence of pulmonary infarct. No pulmonary edema or pleural effusion. Trachea and mainstem bronchi are patent. Upper Abdomen: Incidental cyst in the periphery of the left lobe of the liver. No acute or suspicious findings. Musculoskeletal: Mild degenerative change in the thoracic spine. There are no acute or  suspicious osseous abnormalities. Review of the MIP images confirms the above findings. IMPRESSION: 1. Positive for acute pulmonary embolus with filling defects in the segmental branches of the right lower lobe pulmonary artery, and possibly subsegmental left lower lobe. Thromboembolic burden is small to moderate. 2. Multi chamber cardiomegaly. While the RV to LV ratio is 1 in suggest presence of mild right heart strain, do not recommend Code PE activation given emboli involve the segmental rather than lobar or more proximal branches. 3. **An incidental finding of potential clinical significance has been found. Probable 2.4 cm left thyroid nodule. Recommend nonemergent thyroid ultrasound for characterization.** Critical Value/emergent results were called by telephone at the time of interpretation on 06/24/2018 at 8:55 pm to Dr. Lynden Oxford , who verbally acknowledged these results. Electronically Signed   By: Narda Rutherford M.D.   On: 06/24/2018 20:56    EKG: Independently reviewed. *Sinus rhythm right bundle branch block  Assessment/Plan Principal Problem:   PE (pulmonary thromboembolism) (HCC) Active Problems:   HTN (hypertension)   DM2 (diabetes mellitus, type 2) (HCC)   Hyperlipidemia Incidental thyroid nodule seen on imaging  -Inpatient admission to telemetry bed.  IV heparin.  Check echocardiogram as patient does have a gallop on exam as well as cardiomegaly and right heart strain on chest CT. -Carb consistent diet, sliding scale insulin , check hemoglobin A1c, hold metformin -Continue home meds home meds for hypertension and hyperlipidemia -Needs nonemergent thyroid ultrasound to follow-up incidental nodule   DVT prophylaxis: On heparin drip  code Status: Full Family Communication: Wife at bedside Disposition Plan: Home 1 to 2 days with follow-up with PCP.  Needs to find a PCP in this area.  Admission status: Inpatient telemetry It is my clinical opinion that admission to  INPATIENT is reasonable and necessary because of the expectation that this patient will require hospital care that crosses at least 2 midnights to treat this condition based on the medical complexity of the problems presented.    Synetta Fail MD [facility] Pager [phone]  If 7PM-7AM, please contact night-coverage [email or web address] Password [email or web address]  06/25/2018, 1:31 AM

## 2018-06-25 NOTE — Progress Notes (Signed)
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 Titrate meds as needed Diabetes mellitus Reasonably well controlled A1c 6.5 continue sliding scale resume metformin in the outpatient ?  Murmur I do not appreciate a murmur-await echocardiogram Thyroid nodule Outpatient characterization with ultrasound Hyperlipidemia Continue atorvastatin outpatient management Morbid obesity Needs weight loss in the outpatient setting OHSS OSA? Does need outpatient screening  IV heparin, admitted as inpatient, expect 24 hours stay and check echo and likely can discharge 1/8  Steven MenghiniSamtani, MD  [facility] Direct contact: [phone] --Via amion app OR  --[email or web address]; password [email or web address]  7PM-7AM contact night coverage as above 06/25/2018, 7:46 AM  LOS: 0 days    Interval history/Subjective: Awake alert pleasant in the process of getting echocardiogram when I examined him No chest pain No fever no chills No dark or tarry stool  Objective:  Vitals:  Vitals:   06/25/18 0149 06/25/18 0628  BP: (!) 183/88 (!)  192/93  Pulse:  84  Resp:  20  Temp:  98.1 F (36.7 C)  SpO2:  97%    Exam:  Thick neck Mallampati 4 Awake alert pleasant no icterus no pallor S1-S2 cannot appreciate murmur but suboptimal exam as on left side patient is having echo done Abdomen obese nontender No lower extremity edema cord or palpable discomfort on pressure over calves Central nervous system in tact   I have personally reviewed the following:  DATA   Labs:  CBC within normal limits  A1c 6.5  Imaging studies:  Echocardiogram is pending  Duplex ultrasound lower extremities pending  Scheduled Meds: . amLODipine  5 mg Oral Daily  . atorvastatin  20 mg Oral QHS  . chlorhexidine  15 mL Mouth Rinse BID  . insulin aspart  0-9 Units Subcutaneous TID WC  . iopamidol      . lisinopril  40 mg Oral Daily  . mouth rinse  15 mL Mouth Rinse q12n4p  . sodium chloride (PF)       Continuous Infusions: . heparin 2,000 Units/hr (06/24/18 2156)    Principal Problem:   PE (pulmonary thromboembolism) (HCC) Active Problems:   HTN (hypertension)   DM2 (diabetes mellitus, type 2) (HCC)   Hyperlipidemia   LOS: 0 days

## 2018-06-25 NOTE — Progress Notes (Signed)
ANTICOAGULATION CONSULT NOTE  Pharmacy Consult for IV heparin Indication: pulmonary embolism  No Known Allergies  Patient Measurements: Height: 6\' 4"  (193 cm) Weight: (S) 290 lb (131.5 kg) IBW/kg (Calculated) : 86.8 Heparin Dosing Weight: 115.4 kg  Vital Signs: Temp: 98.1 F (36.7 C) (01/07 0628) Temp Source: Oral (01/07 0628) BP: 192/93 (01/07 0628) Pulse Rate: 84 (01/07 0628)  Labs: Recent Labs    06/24/18 1519 06/24/18 1646 06/25/18 0424 06/25/18 1049  HGB 14.3  --  13.6  --   HCT 44.1  --  41.9  --   PLT 266  --  247  --   APTT  --  34  --   --   LABPROT  --  12.0  --   --   INR  --  0.89  --   --   HEPARINUNFRC  --   --  0.39 0.40  CREATININE 1.04  --   --   --     Estimated Creatinine Clearance: 111.9 mL/min (by C-G formula based on SCr of 1.04 mg/dL).   Medical History: Past Medical History:  Diagnosis Date  . Diabetes mellitus without complication (HCC)   . Hypertension      Assessment: 40 y/oM with PMH of DM, HTN, and recent diagnosis of PNA who presents to [facility] ED on 06/24/2018 with shortness of breath, occasional chest tightness, and lightheadedness. D-dimer 1.63. CTa of chest + for PE. Pharmacy consulted to dose heparin infusion. Patient not on any anticoagulants PTA. Baseline CBC WNL, PT/INR 12/0.89, aPTT 34 seconds.   06/25/2018:  Repeat heparin level therapeutic on 2000 units/hr  CBC WNL  No bleeding or infusion related issues per RN  Goal of Therapy:  Heparin level 0.3-0.7 units/ml Monitor platelets by anticoagulation protocol: Yes   Plan:   Continue Heparin infusion at 2000 units/hr  Daily CBC and heparin level  Monitor closely for s/sx of bleeding  Plan transition to DOAC 06/26/2018  Junita Push, PharmD, BCPS Pager: [phone] 06/25/2018@12 :51 PM

## 2018-06-25 NOTE — Progress Notes (Signed)
*  PRELIMINARY RESULTS* Echocardiogram 2D Echocardiogram has been performed with Definity.  Gerda Diss 06/25/2018, 1:10 PM

## 2018-06-25 NOTE — Care Management Note (Signed)
Case Management Note  Patient Details  Name: Utah Welden MRN: [id number] Date of Birth: [birth year]  Subjective/Objective:  Benefit checked-Xarelto$945.55;eliquis $501.55-per pharmacy patient can use the 30 day free coupon, then $10 co pay therafter-MD updated.                 Action/Plan:d/c home.   Expected Discharge Date:                  Expected Discharge Plan:     In-House Referral:     Discharge planning Services     Post Acute Care Choice:    Choice offered to:     DME Arranged:    DME Agency:     HH Arranged:    HH Agency:     Status of Service:     If discussed at [employer], dates discussed:    Additional Comments:  Lanier Clam, RN 06/25/2018, 11:45 AM

## 2018-06-25 NOTE — Progress Notes (Signed)
Bilateral lower extremity duplex has been completed.   Preliminary results in CV Proc.   Blanch Media 06/25/2018 2:06 PM

## 2018-06-26 DIAGNOSIS — Z7901 Long term (current) use of anticoagulants: Secondary | ICD-10-CM

## 2018-06-26 DIAGNOSIS — I2699 Other pulmonary embolism without acute cor pulmonale: Secondary | ICD-10-CM | POA: Diagnosis not present

## 2018-06-26 LAB — BASIC METABOLIC PANEL
Anion gap: 8 (ref 5–15)
BUN: 18 mg/dL (ref 6–20)
CO2: 25 mmol/L (ref 22–32)
Calcium: 8.8 mg/dL — ABNORMAL LOW (ref 8.9–10.3)
Chloride: 103 mmol/L (ref 98–111)
Creatinine, Ser: 1.06 mg/dL (ref 0.61–1.24)
GFR calc Af Amer: >60 mL/min (ref >60)
Glucose, Bld: 123 mg/dL — ABNORMAL HIGH (ref 70–99)
Potassium: 3.5 mmol/L (ref 3.5–5.1)
Sodium: 136 mmol/L (ref 135–145)

## 2018-06-26 LAB — CBC WITH DIFFERENTIAL/PLATELET
Abs Immature Granulocytes: 0.01 10*3/uL (ref 0.00–0.07)
Basophils Absolute: 0 10*3/uL (ref 0.0–0.1)
Basophils Relative: 1 %
Eosinophils Absolute: 0 10*3/uL (ref 0.0–0.5)
Eosinophils Relative: 1 %
HCT: 43.5 % (ref 39.0–52.0)
Hemoglobin: 13.8 g/dL (ref 13.0–17.0)
Immature Granulocytes: 0 %
Lymphocytes Relative: 39 %
Lymphs Abs: 1.6 10*3/uL (ref 0.7–4.0)
MCH: 26.8 pg (ref 26.0–34.0)
MCHC: 31.7 g/dL (ref 30.0–36.0)
MCV: 84.5 fL (ref 80.0–100.0)
Monocytes Absolute: 0.5 10*3/uL (ref 0.1–1.0)
Monocytes Relative: 13 %
NRBC: 0 % (ref 0.0–0.2)
Neutro Abs: 1.9 10*3/uL (ref 1.7–7.7)
Neutrophils Relative %: 46 %
PLATELETS: 240 10*3/uL (ref 150–400)
RBC: 5.15 MIL/uL (ref 4.22–5.81)
RDW: 14.1 % (ref 11.5–15.5)
WBC: 4.1 10*3/uL (ref 4.0–10.5)

## 2018-06-26 LAB — HEPARIN LEVEL (UNFRACTIONATED): Heparin Unfractionated: 0.47 IU/mL (ref 0.30–0.70)

## 2018-06-26 LAB — GLUCOSE, CAPILLARY
GLUCOSE-CAPILLARY: 125 mg/dL — AB (ref 70–99)
Glucose-Capillary: 147 mg/dL — ABNORMAL HIGH (ref 70–99)

## 2018-06-26 MED ORDER — AMLODIPINE BESYLATE 10 MG PO TABS: 10.0000 mg | ORAL_TABLET | Freq: Every day | ORAL | 3 refills | Status: DC

## 2018-06-26 MED ORDER — ACETAMINOPHEN 325 MG PO TABS: 650.0000 mg | ORAL_TABLET | Freq: Four times a day (QID) | ORAL | 0 refills | Status: DC | PRN

## 2018-06-26 MED ORDER — SENNOSIDES-DOCUSATE SODIUM 8.6-50 MG PO TABS: 2.0000 | ORAL_TABLET | Freq: Every day | ORAL | 1 refills | Status: DC

## 2018-06-26 MED ORDER — APIXABAN 5 MG PO TABS: 5.0000 mg | ORAL_TABLET | Freq: Two times a day (BID) | ORAL | 11 refills | Status: DC

## 2018-06-26 MED ORDER — METFORMIN HCL 500 MG PO TABS: 500.0000 mg | ORAL_TABLET | Freq: Two times a day (BID) | ORAL | 5 refills | Status: AC

## 2018-06-26 MED ORDER — ELIQUIS 5 MG VTE STARTER PACK: ORAL_TABLET | ORAL | 0 refills | Status: DC

## 2018-06-26 MED ORDER — APIXABAN 5 MG PO TABS
10.0000 mg | ORAL_TABLET | Freq: Once | ORAL | Status: AC
  Administered 2018-06-26: 10 mg via ORAL
  Filled 2018-06-26: qty 2

## 2018-06-26 NOTE — Progress Notes (Signed)
Pt discharged home with wife as ordered. Discharge instructions given to pt who verbalized understanding. Wife also instructed. First dose Eliquis given prior to D/C- pt aware to take next dose around 0600 tomorrow. Pt has coupons needed for Eliquis. FMLA disability paperwork also filled out by MD and returned to pt. Pt to be assisted to exit via W/C with assist from nursing staff.

## 2018-06-26 NOTE — Progress Notes (Signed)
ANTICOAGULATION CONSULT NOTE  Pharmacy Consult for IV heparin >> Eliquis Indication: pulmonary embolism  No Known Allergies  Patient Measurements: Height: 6\' 4"  (193 cm) Weight: (S) 290 lb (131.5 kg) IBW/kg (Calculated) : 86.8 Heparin Dosing Weight: 115.4 kg  Vital Signs: Temp: 98.7 F (37.1 C) (01/08 1422) Temp Source: Oral (01/08 1422) BP: 139/75 (01/08 1422) Pulse Rate: 87 (01/08 1422)  Labs: Recent Labs    06/24/18 1519 06/24/18 1646 06/25/18 0424 06/25/18 1049 06/26/18 0527  HGB 14.3  --  13.6  --  13.8  HCT 44.1  --  41.9  --  43.5  PLT 266  --  247  --  240  APTT  --  34  --   --   --   LABPROT  --  12.0  --   --   --   INR  --  0.89  --   --   --   HEPARINUNFRC  --   --  0.39 0.40 0.47  CREATININE 1.04  --   --   --  1.06    Estimated Creatinine Clearance: 109.7 mL/min (by C-G formula based on SCr of 1.06 mg/dL).   Medical History: Past Medical History:  Diagnosis Date  . Diabetes mellitus without complication (HCC)   . Hypertension      Assessment: 63 y/oM with PMH of DM, HTN, and recent diagnosis of PNA who presents to [facility] ED on 06/24/2018 with shortness of breath, occasional chest tightness, and lightheadedness. D-dimer 1.63. CTa of chest + for PE. Pharmacy consulted to dose heparin infusion. Patient not on any anticoagulants PTA. Baseline CBC WNL, PT/INR 12/0.89, aPTT 34 seconds.   06/26/2018:  Most recent heparin level therapeutic this AM  CBC WNL/stable  No bleeding or infusion related issues reported  Goal of Therapy:  Prevent recurrent VTE   Plan:   Begin Eliquis 10 mg PO bid x 7 days followed by 5 mg PO bid thereafter  Stop heparin infusion with first dose of Eliquis  Pharmacy to provide education prior to discharge  Bernadene Person, PharmD, BCPS [phone] 06/26/2018, 3:43 PM

## 2018-06-26 NOTE — Progress Notes (Signed)
SATURATION QUALIFICATIONS: (This note is used to comply with regulatory documentation for home oxygen)  Patient Saturations on Room Air at Rest = 97%  Patient Saturations on Room Air while Ambulating = 92%   Please briefly explain why patient needs home oxygen: 

## 2018-06-26 NOTE — Discharge Summary (Signed)
Steven Tapia, is a 63 y.o. male  DOB 11-Mar-1956  MRN [id number].  Admission date:  06/24/2018  Admitting Physician  Synetta Fail, MD  Discharge Date:  06/26/2018   Primary MD  System, Pcp Not In  Recommendations for primary care physician for things to follow:  1) you will need to take the blood thinner apixaban/Eliquis twice a day most likely for the rest of your life to prevent blood clots 2) you are taking apixaban/Eliquis for blood thinner so Avoid ibuprofen/Advil/Aleve/Motrin/Goody Powders/Naproxen/BC powders/Meloxicam/Diclofenac/Indomethacin and other Nonsteroidal anti-inflammatory medications as these will make you more likely to bleed and can cause stomach ulcers, can also cause Kidney problems.  3) follow-up to primary care physician in about a week for recheck and reevaluation 4) call or return if nosebleeds, dark stools or any evidence of bleeding in stool or urine 5)Obesity and Possible OSA--- BMI is 35, outpatient sleep study advised to rule out obstructive sleep apnea 6) thyroid nodule----outpatient thyroid ultrasound and further evaluation by PCP advised   Admission Diagnosis  Shortness of breath [R06.02] Lightheadedness [R42] Other acute pulmonary embolism, unspecified whether acute cor pulmonale present (HCC) [I26.99]   Discharge Diagnosis  Shortness of breath [R06.02] Lightheadedness [R42] Other acute pulmonary embolism, unspecified whether acute cor pulmonale present (HCC) [I26.99]    Principal Problem:   PE (pulmonary thromboembolism) (HCC) Active Problems:   HTN (hypertension)   DM2 (diabetes mellitus, type 2) (HCC)   Hyperlipidemia      Past Medical History:  Diagnosis Date  . Diabetes mellitus without complication (HCC)   . Hypertension     Past Surgical History:  Procedure Laterality Date  . ACHILLES TENDON REPAIR    . ACHILLES TENDON SURGERY         HPI   from the history and physical done on the day of admission:    Chief Complaint: chest tightness  HPI: Steven Tapia is a 63 y.o. male with medical history significant of Dm, Htn presents with chest tightness.  Patient was diagnosed with pneumonia in December at a facility in [place].  He completed his antibiotics for 10 days.  He continued to have chest tightness which prompted him to come to the ED today.  Denies any leg pain or swelling.  He denies any personal history of heart, lung or blood clot history.  His father did have blood clots in his elderly years.  Denies any shortness of breath or fever.  He has diabetes maintained with metformin.  Also has hypertension hyperlipidemia.  He is a Agricultural consultant.   ED Course: Patient had a mildly elevated d-dimer with a subsequent CTA chest that showed pulmonary embolism with mild  right heart strain.  Patient was quite Hypertensive here.  He however has not taken his night medications.  Patient was started on heparin drip and given a dose of IV hydralazine for his blood pressure.  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 Eliquis, risk versus benefits of anticoagulation discussed with patient questions answered, no extremity Dopplers without acute DVT, suspect pelvic area DVT given that patient is a truck driver and drives for long periods of time  most days.  Echo with preserved EF of 55 to 60%, grade 2 dCHF/HFpEF noted, possibility of right heart strain and pulmonary artery pressures could not be determined on this echo----patient most likely need lifelong anticoagulation....   2)DM2--A1c 6.5 reflecting excellent diabetic control, may resume metformin on 06/28/2017 (add contrast study),   3)HTN--- stablel, given the patient is a truck driver HCTZ may not be optimal medication for him as he will have to stop to urinate from time to time, treat empirically with amlodipine 10 mg daily along with lisinopril 40 mg daily  4)Obesity and Possible OSA--- BMI is 25, outpatient sleep study advised  5) thyroid nodule----outpatient thyroid ultrasound and further evaluation by PCP advised   Consults obtained -pharmacist for Eliquis education Diet and Activity recommendation:  As advised  Discharge Instructions    Discharge Instructions    Call MD for:  difficulty breathing, headache or visual disturbances   Complete by:  As directed    Call MD for:  persistant dizziness or light-headedness   Complete by:  As directed    Call MD for:  persistant nausea and vomiting   Complete by:  As directed    Call MD for:  severe uncontrolled pain   Complete by:  As directed    Call MD for:  temperature >100.4   Complete by:  As directed    Diet - low sodium heart healthy   Complete by:  As directed    Discharge instructions   Complete by:  As directed    1) you will need to take the blood thinner apixaban/Eliquis twice a day most likely for the rest of your life to prevent blood clots 2) you are taking apixaban/Eliquis for blood thinner so Avoid ibuprofen/Advil/Aleve/Motrin/Goody Powders/Naproxen/BC powders/Meloxicam/Diclofenac/Indomethacin and other Nonsteroidal anti-inflammatory medications as these will make you more likely to bleed and can cause stomach ulcers, can also cause Kidney problems.  3) follow-up to primary care physician in about a week  for recheck and reevaluation 4) call or return if nosebleeds, dark stools or any evidence of bleeding in stool or urine 5)Obesity and Possible OSA--- BMI is 35, outpatient sleep study advised to rule out obstructive sleep apnea 6) thyroid nodule----outpatient thyroid ultrasound and further evaluation by PCP advised   Increase activity slowly   Complete by:  As directed        Discharge Medications     Allergies as of 06/26/2018   No Known Allergies     Medication List    TAKE these medications   acetaminophen 325 MG tablet Commonly known as:  TYLENOL Take 2 tablets (650 mg total) by mouth every 6 (six) hours as needed for mild pain, fever or headache (or Fever >/= 101).   amLODipine 10 MG tablet Commonly known as:  NORVASC Take 1 tablet (10 mg total) by mouth daily. For BP  What changed:    medication strength  how much to take  additional instructions   atorvastatin 20 MG tablet Commonly known as:  LIPITOR Take 20 mg by mouth daily.   ELIQUIS STARTER PACK 5 MG Tabs Take as directed on package: start with two-5mg  tablets twice daily for 7 days. On day 8, switch to one-5mg  tablet twice daily---- For Blood Thinner   apixaban 5 MG Tabs tablet Commonly known as:  ELIQUIS Take 1 tablet (5 mg total) by mouth 2 (two) times daily. Start this around 08/15/2018 after completing Eliquis/Apixaban Starter Pack---- Start taking on:  August 15, 2018   lisinopril 40 MG tablet Commonly known as:  PRINIVIL,ZESTRIL Take 40 mg by mouth daily.   metFORMIN 500 MG tablet Commonly known as:  GLUCOPHAGE Take 1 tablet (500 mg total) by mouth 2 (two) times daily with a meal. Start on 06/28/2018 What changed:  additional instructions   senna-docusate 8.6-50 MG tablet Commonly known as:  Senokot-S Take 2 tablets by mouth at bedtime. For bowel       Major procedures and Radiology Reports - PLEASE review detailed and final reports for all details, in brief -    Dg Chest 2  View  Result Date: 06/24/2018 CLINICAL DATA:  63 y.o male states he was seen in an ED in [facility] on 12/24 and was told he had pneumonia and was given oral antibiotics. Patient states he began having intermittent chest pain, intermittent SOB EXAM: CHEST - 2 VIEW COMPARISON:  None. FINDINGS: There is hazy right basilar airspace disease which may reflect atelectasis versus pneumonia. There is no focal parenchymal opacity. There is no pleural effusion or pneumothorax. The heart size is top-normal. The osseous structures are unremarkable. IMPRESSION: Hazy right basilar airspace disease which may reflect atelectasis versus pneumonia. Electronically Signed   By: Elige Ko   On: 06/24/2018 16:02   Ct Angio Chest Pe W And/or Wo Contrast  Result Date: 06/24/2018 CLINICAL DATA:  PE suspected, intermediate prob, positive D-dimer. Patient reports being diagnosed with pneumonia at an outside facility 2 weeks ago. Onset of chest pain and shortness of breath last night. EXAM: CT ANGIOGRAPHY CHEST WITH CONTRAST TECHNIQUE: Multidetector CT imaging of the chest was performed using the standard protocol during bolus administration of intravenous contrast. Multiplanar CT image reconstructions and MIPs were obtained to evaluate the vascular anatomy. CONTRAST:  ISOVUE-370 IOPAMIDOL (ISOVUE-370) INJECTION 76% COMPARISON:  Radiographs earlier this day. FINDINGS: Cardiovascular: Positive for acute pulmonary embolus with filling defects in the segmental branches of the right lower lobe. Possible additional subsegmental filling defects in the left lower lobe, not well assessed due to contrast bolus timing and soft tissue attenuation from habitus. There is multi chamber cardiomegaly with RV to LV ratio of 1. The thoracic aorta is normal in caliber. No aortic dissection. No pericardial effusion. Mediastinum/Nodes: Small mediastinal lymph nodes are not enlarged by size criteria. The esophagus is decompressed. Mild thyroid enlargement with  probable 2.4 cm nodule in the left lobe, partially obscured by dense streak artifact in the subclavian vein. Lungs/Pleura: Minor dependent atelectasis, right greater than left. No evidence of pulmonary infarct. No pulmonary edema or pleural effusion. Trachea and mainstem bronchi are patent. Upper Abdomen: Incidental cyst in the periphery of the left lobe of the liver. No acute or suspicious findings. Musculoskeletal: Mild degenerative change in the thoracic spine. There are no acute or suspicious osseous abnormalities. Review of the MIP images confirms the above findings. IMPRESSION: 1. Positive for acute pulmonary  embolus with filling defects in the segmental branches of the right lower lobe pulmonary artery, and possibly subsegmental left lower lobe. Thromboembolic burden is small to moderate. 2. Multi chamber cardiomegaly. While the RV to LV ratio is 1 in suggest presence of mild right heart strain, do not recommend Code PE activation given emboli involve the segmental rather than lobar or more proximal branches. 3. **An incidental finding of potential clinical significance has been found. Probable 2.4 cm left thyroid nodule. Recommend nonemergent thyroid ultrasound for characterization.** Critical Value/emergent results were called by telephone at the time of interpretation on 06/24/2018 at 8:55 pm to Dr. Lynden Oxford , who verbally acknowledged these results. Electronically Signed   By: Narda Rutherford M.D.   On: 06/24/2018 20:56   Vas [place] Lower Extremity Venous (dvt)  Result Date: 06/25/2018  Lower Venous Study Indications: Pulmonary embolism.  Performing Technologist: Blanch Media RVS  Examination Guidelines: A complete evaluation includes B-mode imaging, spectral Doppler, color Doppler, and power Doppler as needed of all accessible portions of each vessel. Bilateral testing is considered an integral part of a complete examination. Limited examinations for reoccurring indications may be performed as  noted.  Right Venous Findings: +---------+---------------+---------+-----------+----------+--------------+          CompressibilityPhasicitySpontaneityPropertiesSummary        +---------+---------------+---------+-----------+----------+--------------+ CFV      Full           Yes      Yes                                 +---------+---------------+---------+-----------+----------+--------------+ SFJ      Full                                                        +---------+---------------+---------+-----------+----------+--------------+ FV Prox  Full                                                        +---------+---------------+---------+-----------+----------+--------------+ FV Mid   Full                                                        +---------+---------------+---------+-----------+----------+--------------+ FV DistalFull                                                        +---------+---------------+---------+-----------+----------+--------------+ PFV      Full                                                        +---------+---------------+---------+-----------+----------+--------------+ POP      Full  Yes      Yes                                 +---------+---------------+---------+-----------+----------+--------------+ PTV      Full                                                        +---------+---------------+---------+-----------+----------+--------------+ PERO                                                  Not visualized +---------+---------------+---------+-----------+----------+--------------+  Left Venous Findings: +---------+---------------+---------+-----------+----------+--------------+          CompressibilityPhasicitySpontaneityPropertiesSummary        +---------+---------------+---------+-----------+----------+--------------+ CFV      Full           Yes      Yes                                  +---------+---------------+---------+-----------+----------+--------------+ SFJ      Full                                                        +---------+---------------+---------+-----------+----------+--------------+ FV Prox  Full                                                        +---------+---------------+---------+-----------+----------+--------------+ FV Mid   Full                                                        +---------+---------------+---------+-----------+----------+--------------+ FV DistalFull                                                        +---------+---------------+---------+-----------+----------+--------------+ PFV      Full                                                        +---------+---------------+---------+-----------+----------+--------------+ POP      Full           Yes      Yes                                 +---------+---------------+---------+-----------+----------+--------------+ PTV  Full                                                        +---------+---------------+---------+-----------+----------+--------------+ PERO                                                  Not visualized +---------+---------------+---------+-----------+----------+--------------+    Summary: Right: There is no evidence of deep vein thrombosis in the lower extremity. No cystic structure found in the popliteal fossa. Left: There is no evidence of deep vein thrombosis in the lower extremity. No cystic structure found in the popliteal fossa.  *See table(s) above for measurements and observations. Electronically signed by Tonny Bollman MD on 06/25/2018 at 8:06:50 PM.    Final     Micro Results    Today   Subjective    Steven Tapia today has no new complaints, no shortness of breath at rest, no chest pain, ambulated in hallways, noted significant dyspnea on exertion, no hypoxia post ambulation   Patient has been  seen and examined prior to discharge   Objective   Blood pressure 139/75, pulse 87, temperature 98.7 F (37.1 C), temperature source Oral, resp. rate 16, height 6\' 4"  (1.93 m), weight (S) 131.5 kg, SpO2 94 %.   Intake/Output Summary (Last 24 hours) at 06/26/2018 1614 Last data filed at 06/26/2018 0926 Gross per 24 hour  Intake 888.81 ml  Output -  Net 888.81 ml    Exam Gen:- Awake Alert, no acute distress , able to speak in complete sentences HEENT:- [place].AT, No sclera icterus Neck-Supple Neck,No JVD,.  Lungs-  CTAB , good air movement bilaterally  CV- S1, S2 normal, regular Abd-  +ve B.Sounds, Abd Soft, No tenderness,    Extremity/Skin:- No  edema,   good pulses Psych-affect is appropriate, oriented x3 Neuro-no new focal deficits, no tremors    Data Review   CBC w Diff:  Lab Results  Component Value Date   WBC 4.1 06/26/2018   HGB 13.8 06/26/2018   HCT 43.5 06/26/2018   PLT 240 06/26/2018   LYMPHOPCT 39 06/26/2018   MONOPCT 13 06/26/2018   EOSPCT 1 06/26/2018   BASOPCT 1 06/26/2018    CMP:  Lab Results  Component Value Date   NA 136 06/26/2018   K 3.5 06/26/2018   CL 103 06/26/2018   CO2 25 06/26/2018   BUN 18 06/26/2018   CREATININE 1.06 06/26/2018  .   Total Discharge time is about 33 minutes  Shon Hale M.D on 06/26/2018 at 4:14 PM  Pager---[phone]  Go to [email or web address] - password [email or web address] for contact info  [facility] - Office  [phone]

## 2018-06-26 NOTE — Progress Notes (Signed)
ANTICOAGULATION CONSULT NOTE  Pharmacy Consult for IV heparin Indication: pulmonary embolism  No Known Allergies  Patient Measurements: Height: 6\' 4"  (193 cm) Weight: (S) 290 lb (131.5 kg) IBW/kg (Calculated) : 86.8 Heparin Dosing Weight: 115.4 kg  Vital Signs: Temp: 98.9 F (37.2 C) (01/08 0436) Temp Source: Oral (01/08 0436) BP: 135/81 (01/08 0436) Pulse Rate: 82 (01/08 0436)  Labs: Recent Labs    06/24/18 1519 06/24/18 1646 06/25/18 0424 06/25/18 1049 06/26/18 0527  HGB 14.3  --  13.6  --  13.8  HCT 44.1  --  41.9  --  43.5  PLT 266  --  247  --  240  APTT  --  34  --   --   --   LABPROT  --  12.0  --   --   --   INR  --  0.89  --   --   --   HEPARINUNFRC  --   --  0.39 0.40 0.47  CREATININE 1.04  --   --   --  1.06    Estimated Creatinine Clearance: 109.7 mL/min (by C-G formula based on SCr of 1.06 mg/dL).   Medical History: Past Medical History:  Diagnosis Date  . Diabetes mellitus without complication (HCC)   . Hypertension      Assessment: 43 y/oM with PMH of DM, HTN, and recent diagnosis of PNA who presents to [facility] ED on 06/24/2018 with shortness of breath, occasional chest tightness, and lightheadedness. D-dimer 1.63. CTa of chest + for PE. Pharmacy consulted to dose heparin infusion. Patient not on any anticoagulants PTA. Baseline CBC WNL, PT/INR 12/0.89, aPTT 34 seconds.   06/26/2018:  AM heparin level therapeutic on 2000 units/hr  CBC WNL  No bleeding or infusion related issues reported  Goal of Therapy:  Heparin level 0.3-0.7 units/ml Monitor platelets by anticoagulation protocol: Yes   Plan:   Continue Heparin infusion at 2000 units/hr  Daily CBC and heparin level  Monitor closely for s/sx of bleeding  Plan transition to DOAC 06/26/2018  Alberteen Spindle, PharmD Candidate  Pharmacy: [phone] 06/26/2018@7 :34 AM

## 2018-06-26 NOTE — Care Management Note (Signed)
Case Management Note  Patient Details  Name: Steven Tapia MRN: [id number] Date of Birth: Oct 31, 1955  Subjective/Objective:   No further CM needs.                 Action/Plan:dc home.   Expected Discharge Date:                  Expected Discharge Plan:  Home/Self Care  In-House Referral:     Discharge planning Services  CM Consult  Post Acute Care Choice:    Choice offered to:     DME Arranged:    DME Agency:     HH Arranged:    HH Agency:     Status of Service:  Completed, signed off  If discussed at [employer] of Stay Meetings, dates discussed:    Additional Comments:  Lanier Clam, RN 06/26/2018, 1:34 PM

## 2018-06-26 NOTE — Discharge Instructions (Signed)
1) you will need to take the blood thinner apixaban/Eliquis twice a day most likely for the rest of your life to prevent blood clots 2) you are taking apixaban/Eliquis for blood thinner so Avoid ibuprofen/Advil/Aleve/Motrin/Goody Powders/Naproxen/BC powders/Meloxicam/Diclofenac/Indomethacin and other Nonsteroidal anti-inflammatory medications as these will make you more likely to bleed and can cause stomach ulcers, can also cause Kidney problems.  3) follow-up to primary care physician in about a week for recheck and reevaluation 4) call or return if nosebleeds, dark stools or any evidence of bleeding in stool or urine 5)Obesity and Possible OSA--- BMI is 35, outpatient sleep study advised to rule out obstructive sleep apnea 6) thyroid nodule----outpatient thyroid ultrasound and further evaluation by PCP advised  Information on my medicine - ELIQUIS (apixaban)  Why was Eliquis prescribed for you? Eliquis was prescribed to treat blood clots that may have been found in the veins of your legs (deep vein thrombosis) or in your lungs (pulmonary embolism) and to reduce the risk of them occurring again.  What do You need to know about Eliquis ? The starting dose is 10 mg (two 5 mg tablets) taken TWICE daily for the FIRST SEVEN (7) DAYS, then on (enter date)  07/04/18  the dose is reduced to ONE 5 mg tablet taken TWICE daily.  Eliquis may be taken with or without food.   Try to take the dose about the same time in the morning and in the evening. If you have difficulty swallowing the tablet whole please discuss with your pharmacist how to take the medication safely.  Take Eliquis exactly as prescribed and DO NOT stop taking Eliquis without talking to the doctor who prescribed the medication.  Stopping may increase your risk of developing a new blood clot.  Refill your prescription before you run out.  After discharge, you should have regular check-up appointments with your healthcare provider that is  prescribing your Eliquis.    What do you do if you miss a dose? If a dose of ELIQUIS is not taken at the scheduled time, take it as soon as possible on the same day and twice-daily administration should be resumed. The dose should not be doubled to make up for a missed dose.  Important Safety Information A possible side effect of Eliquis is bleeding. You should call your healthcare provider right away if you experience any of the following: ? Bleeding from an injury or your nose that does not stop. ? Unusual colored urine (red or dark brown) or unusual colored stools (red or black). ? Unusual bruising for unknown reasons. ? A serious fall or if you hit your head (even if there is no bleeding).  Some medicines may interact with Eliquis and might increase your risk of bleeding or clotting while on Eliquis. To help avoid this, consult your healthcare provider or pharmacist prior to using any new prescription or non-prescription medications, including herbals, vitamins, non-steroidal anti-inflammatory drugs (NSAIDs) and supplements.  This website has more information on Eliquis (apixaban): http://www.eliquis.com/eliquis/home

## 2018-07-29 ENCOUNTER — Telehealth: Payer: Self-pay | Admitting: Hematology

## 2018-07-29 NOTE — Telephone Encounter (Signed)
Received call from pt to cxl and resch new patient appt tomorrow. Pt was unaware of appt and needed to r/s to 3/2 at 0830

## 2018-07-30 ENCOUNTER — Ambulatory Visit: Payer: PRIVATE HEALTH INSURANCE | Admitting: Hematology & Oncology

## 2018-07-30 ENCOUNTER — Other Ambulatory Visit: Payer: PRIVATE HEALTH INSURANCE

## 2018-08-13 ENCOUNTER — Other Ambulatory Visit: Payer: Self-pay | Admitting: Hematology

## 2018-08-13 DIAGNOSIS — I2699 Other pulmonary embolism without acute cor pulmonale: Secondary | ICD-10-CM

## 2018-08-13 NOTE — Progress Notes (Signed)
[facility] CONSULT NOTE  Patient Care Team: Ronney Lion, NP as PCP - General (Family Medicine)  HEME/ONC OVERVIEW: 1. 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 Incidental thyroid nodule -06/2018: 2.4cm left thyroid nodule noted incidentally   TREATMENT REGIMEN:  06/2018 - 07/2018: Eliquis x 1 month; discontinued due to high copya  Late 07/2018 - present: warfarin by PCP   ASSESSMENT & PLAN:   Provoked PTE (new) -I reviewed the patient's records in detail, including recent ER notes, lab studies, and imaging results -I also independently reviewed the radiologic images of CTA chest, and agree with the findings as documented -In summary, patient was diagnosed with pneumonia in 05/2018, for which he was completed a course of antibiotics without improvement in his chest tightness.  He presented to ER in 06/2018 for further evaluation, and CTA chest showed bilateral PTE involving RLL and LLL of the lungs, small to moderate clot burden.  TTE showed normal LVEF, but RV was poorly visualized.  Doppler of lower extremities was negative for DVT.  Patient was treated with heparin drip, and transitioned to Eliquis prior to discharge. -I reviewed with the patient about the plan for care for PTE. -Given the patient's occupation as a Agricultural consultant, the recent episode of blood clot appeared to be provoked. We discussed about the pros and cons about testing for thrombophilia disorder. His current anticoagulation therapy will interfere with some the tests and it is not possible to interpret the test results. Taking him off the anticoagulation therapy to do the tests may precipitate another thrombotic event. I do not see a reason  to order additional testing to screen for thrombophilia disorder as it would not change our management. In the absence of major contraindications, the goal of anticoagulation therapy is six months.  -Patient tolerated Eliquis well after discharge, but due to high co-pay, and anticoagulation was changed to warfarin, and he has been monitored with periodic INR by his PCP.  He reports that his INR level has been subtherapeutic, and his warfarin dose has been slowly uptitrated over the past 2 weeks. -I have ordered repeat iron level today; patient expressed desire to switch to Xarelto due to the convenience and lower co-pay.  Review of the patient's medications show no contraindication. 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 -Should he need any interruption of the anticoagulation for elective procedures in the future, feel free to contact me regarding peri-operative management.  Incidental thyroid nodule (new) -Patient was found with an incidental left thyroid nodule measuring 2.4cm on recent CTA; I personally reviewed the radiologic images, and agree with the findings documented -Clinically, he denies any symptoms associated with the thyroid nodule, such as difficulty with swallowing, enlarging neck or lymphadenopathy -I discussed the imaging  results in detail with the patient -To further evaluate this nodule, I have ordered thyroid  ultrasound, and based on the thyroid ultrasound results, we will determine if biopsy is indicated  Orders Placed This Encounter  Procedures  . US Soft Tissue Head/Neck    Standing Status:   Future    Standing Expiration Date:   08/19/2019    Order Specific Question:   Reason for Exam (SYMPTOM  OR DIAGNOSIS REQUIRED)    Answer:   Left thyroid nodule, incidental    Order Specific Question:   Preferred imaging location?    Answer:   [facility]  . [facility]    Standing Status:   Future    Number of Occurrences:   1    Standing Expiration Date:   09/23/2019  . CBC with Differential (Cancer Center Only)    Standing Status:   Future    Standing Expiration Date:   09/23/2019  . CMP (Cancer Center only)    Standing Status:   Future    Standing Expiration Date:   09/23/2019  . TSH    Standing Status:   Future    Standing Expiration Date:   08/19/2019  . T4, free    Standing Status:   Future    Standing Expiration Date:   08/19/2019   All questions were answered. The patient knows to call the clinic with any problems, questions or concerns.  Return in 3 weeks for labs, imaging results and clinic follow-up.  Arthur Holms, MD 08/19/2018 9:58 AM   CHIEF COMPLAINTS/PURPOSE OF CONSULTATION:  "I am told that I have a clot in my lungs"  HISTORY OF PRESENTING ILLNESS:  Steven Tapia 63 y.o. male is here because of new bilateral PTE. Patient was diagnosed with pneumonia in 05/2018 in [place], for which he was prescribed antibiotics for 10 days.  Despite completing his antibiotic treatment, he continued to have chest tightness, for which he presented to the ER in 06/2018, and was found with bilateral PTE involving RLL and LLL of the lungs.  He was admitted for heparin drip, and transition to Eliquis prior to discharge.  Doppler of the lower extremity was negative for DVT.  TTE showed normal LVEF, but RV was poorly visualized.  Patient tolerated Eliquis well for approximately a month, but was  switched to warfarin in late 07/2018 due to high co-pay.  He briefly discussed Xarelto with his PCP, but was told that he could not be on Xarelto, although no explanation was given.  Patient has never been on Xarelto before or had any allergic reaction to it.  He reports that since being on warfarin, he has occasional vague chest tightness, lasting a few seconds without any associated symptoms, and  resolves without any intervention.  He otherwise denies any constitutional symptoms, dyspnea, abdominal pain, nausea, vomiting, diarrhea, or abnormal bleeding/bruising.  He is up-to-date with colonoscopy, which was done 4 to 5 years ago, and it was reportedly normal.  MEDICAL HISTORY:  Past Medical History:  Diagnosis Date  . Diabetes mellitus without complication (HCC)   . Hypertension     SURGICAL HISTORY: Past Surgical History:  Procedure Laterality Date  . ACHILLES TENDON REPAIR    . ACHILLES TENDON SURGERY      SOCIAL HISTORY: Social History   Socioeconomic History  . Marital status: Married    Spouse name: Not on file  . Number of children: Not on file  . Years of education: Not on file  . Highest  education level: Not on file  Occupational History  . Not on file  Social Needs  . Financial resource strain: Not on file  . Food insecurity:    Worry: Not on file    Inability: Not on file  . Transportation needs:    Medical: Not on file    Non-medical: Not on file  Tobacco Use  . Smoking status: Never Smoker  . Smokeless tobacco: Never Used  Substance and Sexual Activity  . Alcohol use: Never    Frequency: Never  . Drug use: Never  . Sexual activity: Not on file  Lifestyle  . Physical activity:    Days per week: Not on file    Minutes per session: Not on file  . Stress: Not on file  Relationships  . Social connections:    Talks on phone: Not on file    Gets together: Not on file    Attends religious service: Not on file    Active member of club or organization: Not on  file    Attends meetings of clubs or organizations: Not on file    Relationship status: Not on file  . Intimate partner violence:    Fear of current or ex partner: Not on file    Emotionally abused: Not on file    Physically abused: Not on file    Forced sexual activity: Not on file  Other Topics Concern  . Not on file  Social History Narrative  . Not on file    FAMILY HISTORY: Family History  Problem Relation Age of Onset  . Alzheimer's disease Mother   . Clotting disorder Father   . Diabetes Sister     ALLERGIES:  has No Known Allergies.  MEDICATIONS:  Current Outpatient Medications  Medication Sig Dispense Refill  . amLODipine (NORVASC) 10 MG tablet Take 1 tablet (10 mg total) by mouth daily. For BP 30 tablet 3  . atorvastatin (LIPITOR) 20 MG tablet Take 20 mg by mouth daily.    . hydrochlorothiazide (HYDRODIURIL) 25 MG tablet Take 25 mg by mouth daily.    Marland Kitchen lisinopril (PRINIVIL,ZESTRIL) 40 MG tablet Take 40 mg by mouth daily.    . metFORMIN (GLUCOPHAGE) 500 MG tablet Take 1 tablet (500 mg total) by mouth 2 (two) times daily with a meal. Start on 06/28/2018 60 tablet 5  . Rivaroxaban 15 & 20 MG TBPK Take as directed on package: Start with one 15mg  tablet by mouth twice a day with food. On Day 22, switch to one 20mg  tablet once a day with food. 51 each 0   No current facility-administered medications for this visit.     REVIEW OF SYSTEMS:   Constitutional: ( - ) fevers, ( - )  chills , ( - ) night sweats Eyes: ( - ) blurriness of vision, ( - ) double vision, ( - ) watery eyes Ears, nose, mouth, throat, and face: ( - ) mucositis, ( - ) sore throat Respiratory: ( - ) cough, ( - ) dyspnea, ( - ) wheezes Cardiovascular: ( - ) palpitation, ( - ) chest discomfort, ( - ) lower extremity swelling Gastrointestinal:  ( - ) nausea, ( - ) heartburn, ( - ) change in bowel habits Skin: ( - ) abnormal skin rashes Lymphatics: ( - ) new lymphadenopathy, ( - ) easy bruising Neurological:  ( - ) numbness, ( - ) tingling, ( - ) new weaknesses Behavioral/Psych: ( - ) mood change, ( - ) new changes  All  other systems were reviewed with the patient and are negative.  PHYSICAL EXAMINATION: ECOG PERFORMANCE STATUS: 0 - Asymptomatic  Vitals:   08/19/18 0905  BP: (!) 154/91  Pulse: 99  Resp: 17  Temp: 97.7 F (36.5 C)  SpO2: 98%   Filed Weights   08/19/18 0905  Weight: 296 lb 12 oz (134.6 kg)    GENERAL: alert, no distress and comfortable, very obese SKIN: skin color, texture, turgor are normal, no rashes or significant lesions EYES: conjunctiva are pink and non-injected, sclera clear OROPHARYNX: no exudate, no erythema; lips, buccal mucosa, and tongue normal  NECK: supple, non-tender LYMPH:  no palpable lymphadenopathy in the cervical LUNGS: clear to auscultation with normal breathing effort HEART: regular rate & rhythm, no murmurs, no lower extremity edema ABDOMEN: soft, non-tender, non-distended, normal bowel sounds Musculoskeletal: no cyanosis of digits and no clubbing  PSYCH: alert & oriented x 3, fluent speech NEURO: no focal motor/sensory deficits  LABORATORY DATA:  I have reviewed the data as listed Lab Results  Component Value Date   WBC 5.0 08/19/2018   HGB 13.6 08/19/2018   HCT 41.5 08/19/2018   MCV 83.2 08/19/2018   PLT 268 08/19/2018   Lab Results  Component Value Date   NA 138 08/19/2018   K 3.5 08/19/2018   CL 102 08/19/2018   CO2 29 08/19/2018    RADIOGRAPHIC STUDIES: I have personally reviewed the radiological images as listed and agreed with the findings in the report.  PATHOLOGY: I have reviewed the pathology reports as documented in the oncologist history.

## 2018-08-19 ENCOUNTER — Inpatient Hospital Stay (HOSPITAL_BASED_OUTPATIENT_CLINIC_OR_DEPARTMENT_OTHER): Payer: PRIVATE HEALTH INSURANCE | Admitting: Hematology

## 2018-08-19 ENCOUNTER — Encounter: Payer: Self-pay | Admitting: Hematology

## 2018-08-19 ENCOUNTER — Inpatient Hospital Stay: Payer: PRIVATE HEALTH INSURANCE | Attending: Hematology

## 2018-08-19 ENCOUNTER — Telehealth: Payer: Self-pay | Admitting: *Deleted

## 2018-08-19 ENCOUNTER — Other Ambulatory Visit: Payer: Self-pay

## 2018-08-19 VITALS — BP 154/91 | HR 99 | Temp 97.7°F | Resp 17 | Ht 76.0 in | Wt 296.8 lb

## 2018-08-19 DIAGNOSIS — I2699 Other pulmonary embolism without acute cor pulmonale: Secondary | ICD-10-CM | POA: Insufficient documentation

## 2018-08-19 DIAGNOSIS — I517 Cardiomegaly: Secondary | ICD-10-CM | POA: Insufficient documentation

## 2018-08-19 DIAGNOSIS — Z7901 Long term (current) use of anticoagulants: Secondary | ICD-10-CM | POA: Diagnosis not present

## 2018-08-19 DIAGNOSIS — E041 Nontoxic single thyroid nodule: Secondary | ICD-10-CM | POA: Diagnosis not present

## 2018-08-19 LAB — CMP (CANCER CENTER ONLY)
ALBUMIN: 4.1 g/dL (ref 3.5–5.0)
ALT: 26 U/L (ref 0–44)
AST: 15 U/L (ref 15–41)
Alkaline Phosphatase: 81 U/L (ref 38–126)
Anion gap: 7 (ref 5–15)
BUN: 21 mg/dL — ABNORMAL HIGH (ref 6–20)
CO2: 29 mmol/L (ref 22–32)
Calcium: 9.2 mg/dL (ref 8.9–10.3)
Chloride: 102 mmol/L (ref 98–111)
Creatinine: 1.04 mg/dL (ref 0.61–1.24)
GFR, Est AFR Am: >60 mL/min (ref >60)
GFR, Estimated: >60 mL/min (ref >60)
Glucose, Bld: 134 mg/dL — ABNORMAL HIGH (ref 70–99)
Potassium: 3.5 mmol/L (ref 3.5–5.1)
Sodium: 138 mmol/L (ref 135–145)
Total Bilirubin: 0.4 mg/dL (ref 0.3–1.2)
Total Protein: 7.6 g/dL (ref 6.5–8.1)

## 2018-08-19 LAB — CBC WITH DIFFERENTIAL (CANCER CENTER ONLY)
Abs Immature Granulocytes: 0.01 10*3/uL (ref 0.00–0.07)
Basophils Absolute: 0 10*3/uL (ref 0.0–0.1)
Basophils Relative: 0 %
Eosinophils Absolute: 0.3 10*3/uL (ref 0.0–0.5)
Eosinophils Relative: 6 %
HEMATOCRIT: 41.5 % (ref 39.0–52.0)
Hemoglobin: 13.6 g/dL (ref 13.0–17.0)
Immature Granulocytes: 0 %
Lymphocytes Relative: 40 %
Lymphs Abs: 2 10*3/uL (ref 0.7–4.0)
MCH: 27.3 pg (ref 26.0–34.0)
MCHC: 32.8 g/dL (ref 30.0–36.0)
MCV: 83.2 fL (ref 80.0–100.0)
Monocytes Absolute: 0.6 10*3/uL (ref 0.1–1.0)
Monocytes Relative: 12 %
Neutro Abs: 2 10*3/uL (ref 1.7–7.7)
Neutrophils Relative %: 42 %
Platelet Count: 268 10*3/uL (ref 150–400)
RBC: 4.99 MIL/uL (ref 4.22–5.81)
RDW: 13.7 % (ref 11.5–15.5)
WBC Count: 5 10*3/uL (ref 4.0–10.5)
nRBC: 0 % (ref 0.0–0.2)

## 2018-08-19 LAB — PROTIME-INR
INR: 1.6 — ABNORMAL HIGH (ref 0.8–1.2)
Prothrombin Time: 18.7 seconds — ABNORMAL HIGH (ref 11.4–15.2)

## 2018-08-19 LAB — D-DIMER, QUANTITATIVE: D-Dimer, Quant: <0.27 ug/mL-FEU (ref 0.00–0.50)

## 2018-08-19 MED ORDER — RIVAROXABAN (XARELTO) VTE STARTER PACK (15 & 20 MG): ORAL_TABLET | ORAL | 0 refills | Status: DC

## 2018-08-19 NOTE — Telephone Encounter (Signed)
Per MD, notified pt INR 1.6, to pick up Xarelto starter pack and start today, stop Warafin. Pt verbalized understanding.

## 2018-08-20 ENCOUNTER — Telehealth: Payer: Self-pay | Admitting: *Deleted

## 2018-08-20 NOTE — Telephone Encounter (Signed)
Patient called and left a voice mail message stating,"the Xarelto starter pack cost me $884. My insurance doesn't cover this. Is there a copay card that would pay for the first month? Is there any patient assistance? Return number is [phone]. I returned his call and told him I would send this to our Financial Counselor and see if she can look into it. He verbalized understanding.

## 2018-08-23 ENCOUNTER — Telehealth: Payer: Self-pay | Admitting: *Deleted

## 2018-08-23 NOTE — Telephone Encounter (Signed)
Call to pt PCP office [phone] lmovm with pts request to have Coumadin and INR managed at their office.

## 2018-08-23 NOTE — Telephone Encounter (Signed)
Returned call to pt after reviewed w/ MD regarding pt's concerns.Pt is unable to pay 884$ copay/deductible for Xarelto. MD advised pt can speak with our financial advisor to look at copay assistance or return to PCP for management of Coumadin. PCP will also need to manage the bx results as well. Pt advised he would like to return to PCP for management of Coumadin/INR as he is able to afford this medication.No further concerns.

## 2018-08-28 ENCOUNTER — Ambulatory Visit (HOSPITAL_BASED_OUTPATIENT_CLINIC_OR_DEPARTMENT_OTHER)
Admission: RE | Admit: 2018-08-28 | Discharge: 2018-08-28 | Disposition: A | Discharge status: Home / Self Care | Payer: No Typology Code available for payment source | Source: Ambulatory Visit | Attending: Hematology | Admitting: Hematology

## 2018-08-28 ENCOUNTER — Telehealth: Payer: Self-pay | Admitting: *Deleted

## 2018-08-28 ENCOUNTER — Other Ambulatory Visit: Payer: Self-pay

## 2018-08-28 DIAGNOSIS — E041 Nontoxic single thyroid nodule: Secondary | ICD-10-CM | POA: Insufficient documentation

## 2018-08-28 NOTE — Telephone Encounter (Addendum)
Patient is aware of results and recommendation   ----- Message from Arthur Holms, MD sent at 08/28/2018  1:51 PM EDT ----- Can we let Mr. Steven Tapia know that his thyroid US didn't show anything that required biopsy? He will need periodic follow-up with his PCP for surveillance thyroid US.  Thanks. GZ  ----- Message ----- From: Interface, Rad Results In Sent: 08/28/2018   1:27 PM EDT To: Arthur Holms, MD

## 2018-08-29 ENCOUNTER — Telehealth: Payer: Self-pay

## 2018-08-29 NOTE — Telephone Encounter (Signed)
NOTES ON FILE 

## 2018-09-10 ENCOUNTER — Ambulatory Visit: Payer: PRIVATE HEALTH INSURANCE | Admitting: Hematology

## 2018-09-10 ENCOUNTER — Other Ambulatory Visit: Payer: PRIVATE HEALTH INSURANCE

## 2019-03-17 ENCOUNTER — Emergency Department (HOSPITAL_COMMUNITY): Payer: BC Managed Care – PPO

## 2019-03-17 ENCOUNTER — Other Ambulatory Visit: Payer: Self-pay

## 2019-03-17 ENCOUNTER — Encounter (HOSPITAL_COMMUNITY): Payer: Self-pay | Admitting: Emergency Medicine

## 2019-03-17 ENCOUNTER — Inpatient Hospital Stay (HOSPITAL_COMMUNITY)
Admission: EM | Admit: 2019-03-17 | Discharge: 2019-03-22 | DRG: 270 | Disposition: A | Discharge status: Home / Self Care | Payer: BC Managed Care – PPO | Attending: Cardiology | Admitting: Cardiology

## 2019-03-17 DIAGNOSIS — Z4682 Encounter for fitting and adjustment of non-vascular catheter: Secondary | ICD-10-CM

## 2019-03-17 DIAGNOSIS — J90 Pleural effusion, not elsewhere classified: Secondary | ICD-10-CM | POA: Diagnosis not present

## 2019-03-17 DIAGNOSIS — I3139 Other pericardial effusion (noninflammatory): Secondary | ICD-10-CM | POA: Diagnosis present

## 2019-03-17 DIAGNOSIS — I314 Cardiac tamponade: Secondary | ICD-10-CM | POA: Diagnosis not present

## 2019-03-17 DIAGNOSIS — Z833 Family history of diabetes mellitus: Secondary | ICD-10-CM | POA: Diagnosis not present

## 2019-03-17 DIAGNOSIS — G4733 Obstructive sleep apnea (adult) (pediatric): Secondary | ICD-10-CM | POA: Diagnosis not present

## 2019-03-17 DIAGNOSIS — Z7984 Long term (current) use of oral hypoglycemic drugs: Secondary | ICD-10-CM

## 2019-03-17 DIAGNOSIS — I5033 Acute on chronic diastolic (congestive) heart failure: Secondary | ICD-10-CM | POA: Diagnosis not present

## 2019-03-17 DIAGNOSIS — Z7901 Long term (current) use of anticoagulants: Secondary | ICD-10-CM

## 2019-03-17 DIAGNOSIS — I10 Essential (primary) hypertension: Secondary | ICD-10-CM | POA: Diagnosis present

## 2019-03-17 DIAGNOSIS — J939 Pneumothorax, unspecified: Secondary | ICD-10-CM

## 2019-03-17 DIAGNOSIS — Z79899 Other long term (current) drug therapy: Secondary | ICD-10-CM | POA: Diagnosis not present

## 2019-03-17 DIAGNOSIS — E669 Obesity, unspecified: Secondary | ICD-10-CM | POA: Diagnosis present

## 2019-03-17 DIAGNOSIS — Z86711 Personal history of pulmonary embolism: Secondary | ICD-10-CM | POA: Diagnosis not present

## 2019-03-17 DIAGNOSIS — E785 Hyperlipidemia, unspecified: Secondary | ICD-10-CM | POA: Diagnosis present

## 2019-03-17 DIAGNOSIS — R000 Tachycardia, unspecified: Secondary | ICD-10-CM | POA: Diagnosis not present

## 2019-03-17 DIAGNOSIS — D62 Acute posthemorrhagic anemia: Secondary | ICD-10-CM | POA: Diagnosis not present

## 2019-03-17 DIAGNOSIS — T45515A Adverse effect of anticoagulants, initial encounter: Secondary | ICD-10-CM | POA: Diagnosis not present

## 2019-03-17 DIAGNOSIS — Z6836 Body mass index (BMI) 36.0-36.9, adult: Secondary | ICD-10-CM | POA: Diagnosis not present

## 2019-03-17 DIAGNOSIS — I313 Pericardial effusion (noninflammatory): Secondary | ICD-10-CM | POA: Diagnosis not present

## 2019-03-17 DIAGNOSIS — Z9889 Other specified postprocedural states: Secondary | ICD-10-CM

## 2019-03-17 DIAGNOSIS — Z86718 Personal history of other venous thrombosis and embolism: Secondary | ICD-10-CM

## 2019-03-17 DIAGNOSIS — I4510 Unspecified right bundle-branch block: Secondary | ICD-10-CM | POA: Diagnosis present

## 2019-03-17 DIAGNOSIS — E119 Type 2 diabetes mellitus without complications: Secondary | ICD-10-CM | POA: Diagnosis not present

## 2019-03-17 DIAGNOSIS — Z20828 Contact with and (suspected) exposure to other viral communicable diseases: Secondary | ICD-10-CM | POA: Diagnosis not present

## 2019-03-17 DIAGNOSIS — R0602 Shortness of breath: Secondary | ICD-10-CM | POA: Diagnosis not present

## 2019-03-17 DIAGNOSIS — I480 Paroxysmal atrial fibrillation: Secondary | ICD-10-CM | POA: Diagnosis not present

## 2019-03-17 HISTORY — DX: Other pulmonary embolism without acute cor pulmonale: I26.99

## 2019-03-17 HISTORY — DX: Cardiac tamponade: I31.4

## 2019-03-17 LAB — CBC
HCT: 34.7 % — ABNORMAL LOW (ref 39.0–52.0)
Hemoglobin: 11.2 g/dL — ABNORMAL LOW (ref 13.0–17.0)
MCH: 26.7 pg (ref 26.0–34.0)
MCHC: 32.3 g/dL (ref 30.0–36.0)
MCV: 82.8 fL (ref 80.0–100.0)
Platelets: 316 10*3/uL (ref 150–400)
RBC: 4.19 MIL/uL — ABNORMAL LOW (ref 4.22–5.81)
RDW: 16.3 % — ABNORMAL HIGH (ref 11.5–15.5)
WBC: 4.8 10*3/uL (ref 4.0–10.5)
nRBC: 0 % (ref 0.0–0.2)

## 2019-03-17 LAB — HEPATIC FUNCTION PANEL
ALT: 52 U/L — ABNORMAL HIGH (ref 0–44)
AST: 23 U/L (ref 15–41)
Albumin: 3.6 g/dL (ref 3.5–5.0)
Alkaline Phosphatase: 87 U/L (ref 38–126)
Bilirubin, Direct: 0.2 mg/dL (ref 0.0–0.2)
Indirect Bilirubin: 0.9 mg/dL (ref 0.3–0.9)
Total Bilirubin: 1.1 mg/dL (ref 0.3–1.2)
Total Protein: 7.4 g/dL (ref 6.5–8.1)

## 2019-03-17 LAB — URINALYSIS, ROUTINE W REFLEX MICROSCOPIC
Bilirubin Urine: NEGATIVE
Glucose, UA: 150 mg/dL — AB
Ketones, ur: NEGATIVE mg/dL
Leukocytes,Ua: NEGATIVE
Nitrite: NEGATIVE
Protein, ur: NEGATIVE mg/dL
Specific Gravity, Urine: 1.015 (ref 1.005–1.030)
pH: 6 (ref 5.0–8.0)

## 2019-03-17 LAB — BASIC METABOLIC PANEL
Anion gap: 11 (ref 5–15)
BUN: 18 mg/dL (ref 8–23)
CO2: 24 mmol/L (ref 22–32)
Calcium: 8.7 mg/dL — ABNORMAL LOW (ref 8.9–10.3)
Chloride: 100 mmol/L (ref 98–111)
Creatinine, Ser: 1.03 mg/dL (ref 0.61–1.24)
GFR calc Af Amer: >60 mL/min (ref >60)
GFR calc non Af Amer: >60 mL/min (ref >60)
Glucose, Bld: 139 mg/dL — ABNORMAL HIGH (ref 70–99)
Potassium: 3.6 mmol/L (ref 3.5–5.1)
Sodium: 135 mmol/L (ref 135–145)

## 2019-03-17 LAB — C-REACTIVE PROTEIN: CRP: 3.1 mg/dL — ABNORMAL HIGH (ref <1.0)

## 2019-03-17 LAB — SARS CORONAVIRUS 2 BY RT PCR (HOSPITAL ORDER, PERFORMED IN ~~LOC~~ HOSPITAL LAB): SARS Coronavirus 2: NEGATIVE

## 2019-03-17 LAB — TSH: TSH: 0.466 u[IU]/mL (ref 0.350–4.500)

## 2019-03-17 LAB — PROTIME-INR
INR: 2.6 — ABNORMAL HIGH (ref 0.8–1.2)
Prothrombin Time: 27.6 seconds — ABNORMAL HIGH (ref 11.4–15.2)

## 2019-03-17 LAB — TROPONIN I (HIGH SENSITIVITY)
Troponin I (High Sensitivity): 3 ng/L (ref <18)
Troponin I (High Sensitivity): 3 ng/L (ref <18)

## 2019-03-17 LAB — LIPASE, BLOOD: Lipase: 23 U/L (ref 11–51)

## 2019-03-17 LAB — GLUCOSE, CAPILLARY: Glucose-Capillary: 102 mg/dL — ABNORMAL HIGH (ref 70–99)

## 2019-03-17 MED ORDER — ACETAMINOPHEN 650 MG RE SUPP: 650.0000 mg | Freq: Four times a day (QID) | RECTAL | Status: DC | PRN

## 2019-03-17 MED ORDER — INSULIN ASPART 100 UNIT/ML ~~LOC~~ SOLN
0.0000 [IU] | Freq: Every day | SUBCUTANEOUS | Status: DC
  Filled 2019-03-17: qty 0.05

## 2019-03-17 MED ORDER — ACETAMINOPHEN 325 MG PO TABS: 650.0000 mg | ORAL_TABLET | Freq: Four times a day (QID) | ORAL | Status: DC | PRN

## 2019-03-17 MED ORDER — ONDANSETRON HCL 4 MG PO TABS: 4.0000 mg | ORAL_TABLET | Freq: Four times a day (QID) | ORAL | Status: DC | PRN

## 2019-03-17 MED ORDER — LISINOPRIL 20 MG PO TABS
40.0000 mg | ORAL_TABLET | Freq: Every day | ORAL | Status: DC
  Filled 2019-03-17: qty 2

## 2019-03-17 MED ORDER — INSULIN ASPART 100 UNIT/ML ~~LOC~~ SOLN
0.0000 [IU] | Freq: Three times a day (TID) | SUBCUTANEOUS | Status: DC
  Filled 2019-03-17: qty 0.09

## 2019-03-17 MED ORDER — ONDANSETRON HCL 4 MG/2ML IJ SOLN: 4.0000 mg | Freq: Four times a day (QID) | INTRAMUSCULAR | Status: DC | PRN

## 2019-03-17 MED ORDER — IOHEXOL 350 MG/ML SOLN
100.0000 mL | Freq: Once | INTRAVENOUS | Status: AC | PRN
  Administered 2019-03-17: 100 mL via INTRAVENOUS

## 2019-03-17 MED ORDER — AMLODIPINE BESYLATE 5 MG PO TABS
10.0000 mg | ORAL_TABLET | Freq: Every day | ORAL | Status: DC
  Filled 2019-03-17: qty 2

## 2019-03-17 MED ORDER — DOCUSATE SODIUM 100 MG PO CAPS
100.0000 mg | ORAL_CAPSULE | Freq: Two times a day (BID) | ORAL | Status: DC
  Administered 2019-03-17 – 2019-03-22 (×8): 100 mg via ORAL
  Filled 2019-03-17 (×9): qty 1

## 2019-03-17 MED ORDER — SODIUM CHLORIDE (PF) 0.9 % IJ SOLN
INTRAMUSCULAR | Status: AC
  Filled 2019-03-17: qty 50

## 2019-03-17 MED ORDER — SODIUM CHLORIDE 0.9 % IV BOLUS
1000.0000 mL | Freq: Once | INTRAVENOUS | Status: AC
  Administered 2019-03-17: 11:00:00 1000 mL via INTRAVENOUS

## 2019-03-17 MED ORDER — ATORVASTATIN CALCIUM 10 MG PO TABS
20.0000 mg | ORAL_TABLET | Freq: Every day | ORAL | Status: DC
  Administered 2019-03-18 – 2019-03-22 (×5): 20 mg via ORAL
  Filled 2019-03-17 (×2): qty 2
  Filled 2019-03-17: qty 1
  Filled 2019-03-17 (×3): qty 2

## 2019-03-17 NOTE — ED Notes (Signed)
Pt provided tray of [place] and water

## 2019-03-17 NOTE — ED Notes (Signed)
Patient transported to radiology for xray

## 2019-03-17 NOTE — H&P (Signed)
[facility] History and Physical   Patient: Steven Tapia XNA:[id number]   PCP: Levin Bacon, NP DOB: 05/07/1956   DOA: 03/17/2019   DOS: 03/17/2019   DOS: the patient was seen and examined on 03/17/2019  Patient coming from: The patient is coming from home  Chief Complaint: Shortness of breath  HPI: [name] is a 63 y.o. male with Past medical history of type II DM, HTN, PE. Patient presents with complaints of cough and shortness of breath.  He tells me that for past few days his Apple Watch has been showing his heart.  He is higher even at rest.  Occasionally he is getting information from the [place] that his heart rhythm is inconclusive.  He tells me that even in sleep his heart rate remains higher and therefore he felt he should be evaluated. He recently had a physical which showed he had high blood pressure and high heart rate. Mentions he is compliant with all his medication.  Denies any recent change in medication.  No alcohol no drug abuse. He does not have any chest pain chest tightness chest palpitation.  He reports shortness of breath as well as some cough.  Occasional orthopnea but no PND.  No swelling in the leg.  No diarrhea no constipation no nausea no vomiting no dizziness no lightheadedness no focal deficit.  ED Course: Presents with shortness of breath and tachycardia.  Initial work-up unremarkable.  Given the history of DVT and PE protocol was performed.  That was concerning for large pericardial effusion and patient was referred for admission.  At his baseline without any support, he is a truck driver He is independent for most of his ADL;  Does manages his medication on his own.  Review of Systems: as mentioned in the history of present illness.  All other systems reviewed and are negative.  Past Medical History:  Diagnosis Date  . Diabetes mellitus without complication ([place])   . Hypertension   . PE (pulmonary thromboembolism) ([place])    Past Surgical  History:  Procedure Laterality Date  . ACHILLES TENDON REPAIR    . ACHILLES TENDON SURGERY     Social History:  reports that he has never smoked. He has never used smokeless tobacco. He reports that he does not drink alcohol or use drugs.  No Known Allergies  Family history reviewed and not pertinent Family History  Problem Relation Age of Onset  . Alzheimer's disease Mother   . Clotting disorder Father   . Diabetes Sister      Prior to Admission medications   Medication Sig Start Date End Date Taking? Authorizing Provider  amLODipine (NORVASC) 10 MG tablet Take 1 tablet (10 mg total) by mouth daily. For BP 06/26/18  Yes Emokpae, Courage, MD  atorvastatin (LIPITOR) 20 MG tablet Take 20 mg by mouth daily.   Yes [provider]  hydrochlorothiazide (HYDRODIURIL) 25 MG tablet Take 25 mg by mouth daily. 03/01/18  Yes [provider]  lisinopril (PRINIVIL,ZESTRIL) 40 MG tablet Take 40 mg by mouth daily.   Yes [provider]  metFORMIN (GLUCOPHAGE) 500 MG tablet Take 1 tablet (500 mg total) by mouth 2 (two) times daily with a meal. Start on 06/28/2018 06/26/18  Yes Emokpae, Courage, MD  warfarin (COUMADIN) 10 MG tablet Take 10 mg by mouth daily. 02/07/19  Yes [provider]  Rivaroxaban 15 & 20 MG TBPK Take as directed on package: Start with one '15mg'$  tablet by mouth twice a day with food.  On Day 22, switch to one '20mg'$  tablet once a day with food. Patient not taking: Reported on 03/17/2019 08/19/18   Tish Men, MD    Physical Exam: Vitals:   03/17/19 1530 03/17/19 1600 03/17/19 1700 03/17/19 1800  BP: (!) 147/92 127/78 (!) 149/89 (!) 150/84  Pulse: 100 (!) 103 100 98  Resp: (!) 25 (!) 32 (!) 25 (!) 21  Temp:      TempSrc:      SpO2: 96% 97% 96% 96%    General: alert and oriented to time, place, and person. Appear in mild distress, affect appropriate Eyes: PERRL, Conjunctiva normal ENT: Oral Mucosa Clear, moist  Neck: no JVD, no Abnormal Mass Or lumps  Cardiovascular: S1 and S2 Present, no Murmur, peripheral pulses symmetrical Respiratory: good respiratory effort, Bilateral Air entry equal and Decreased, no signs of accessory muscle use, bilateral  Crackles, no wheezes Abdomen: Bowel Sound present, Soft and no tenderness, no hernia Skin: no rashes  Extremities: no Pedal edema, no calf tenderness Neurologic: without any new focal findings Gait not checked due to patient safety concerns  Data Reviewed: I have personally reviewed and interpreted labs, imaging as discussed below.  CBC: Recent Labs  Lab 03/17/19 1039  WBC 4.8  HGB 11.2*  HCT 34.7*  MCV 82.8  PLT 710   Basic Metabolic Panel: Recent Labs  Lab 03/17/19 1039  NA 135  K 3.6  CL 100  CO2 24  GLUCOSE 139*  BUN 18  CREATININE 1.03  CALCIUM 8.7*   GFR: CrCl cannot be calculated (Unknown ideal weight.). Liver Function Tests: Recent Labs  Lab 03/17/19 1054  AST 23  ALT 52*  ALKPHOS 87  BILITOT 1.1  PROT 7.4  ALBUMIN 3.6   Recent Labs  Lab 03/17/19 1054  LIPASE 23   No results for input(s): AMMONIA in the last 168 hours. Coagulation Profile: Recent Labs  Lab 03/17/19 1039  INR 2.6*   Cardiac Enzymes: No results for input(s): CKTOTAL, CKMB, CKMBINDEX, TROPONINI in the last 168 hours. BNP (last 3 results) No results for input(s): PROBNP in the last 8760 hours. HbA1C: No results for input(s): HGBA1C in the last 72 hours. CBG: No results for input(s): GLUCAP in the last 168 hours. Lipid Profile: No results for input(s): CHOL, HDL, LDLCALC, TRIG, CHOLHDL, LDLDIRECT in the last 72 hours. Thyroid Function Tests: No results for input(s): TSH, T4TOTAL, FREET4, T3FREE, THYROIDAB in the last 72 hours. Anemia Panel: No results for input(s): VITAMINB12, FOLATE, FERRITIN, TIBC, IRON, RETICCTPCT in the last 72 hours. Urine analysis:    Component Value Date/Time   COLORURINE YELLOW 03/17/2019 1153   APPEARANCEUR CLEAR 03/17/2019 1153   LABSPEC 1.015  03/17/2019 1153   PHURINE 6.0 03/17/2019 1153   GLUCOSEU 150 (A) 03/17/2019 1153   HGBUR SMALL (A) 03/17/2019 1153   BILIRUBINUR NEGATIVE 03/17/2019 1153   KETONESUR NEGATIVE 03/17/2019 1153   PROTEINUR NEGATIVE 03/17/2019 1153   NITRITE NEGATIVE 03/17/2019 1153   LEUKOCYTESUR NEGATIVE 03/17/2019 1153    Radiological Exams on Admission: Dg Chest 2 View  Result Date: 03/17/2019 CLINICAL DATA:  Tachycardia, hypertension EXAM: CHEST - 2 VIEW COMPARISON:  06/24/2018 FINDINGS: Mild cardiomegaly, stable. Mild pulmonary vascular congestion. Patchy bibasilar opacities, left worse than right. No pleural effusion. No pneumothorax. IMPRESSION: 1. Cardiomegaly with mild pulmonary vascular congestion. 2. Bibasilar opacities, left greater than right, which may reflect atelectasis and/or pneumonia in the appropriate clinical setting. Electronically Signed   By: Davina Poke M.D.   On: 03/17/2019 12:29  Ct Angio Chest Pe W And/or Wo Contrast  Result Date: 03/17/2019 CLINICAL DATA:  Elevated blood pressure. History of diabetes and pulmonary embolism. EXAM: CT ANGIOGRAPHY CHEST WITH CONTRAST TECHNIQUE: Multidetector CT imaging of the chest was performed using the standard protocol during bolus administration of intravenous contrast. Multiplanar CT image reconstructions and MIPs were obtained to evaluate the vascular anatomy. CONTRAST:  174m OMNIPAQUE IOHEXOL 350 MG/ML SOLN COMPARISON:  Prior CT 06/24/2018. FINDINGS: Cardiovascular: The pulmonary arteries are well opacified with contrast to the level of the subsegmental branches. There is no evidence of acute pulmonary embolism. The aorta and great vessels demonstrate no significant findings. There is a new moderate to large pericardial effusion. The heart size is normal. There is no significant reflux of contrasted blood into the IVC or hepatic veins. Mediastinum/Nodes: There are no enlarged mediastinal, hilar or axillary lymph nodes.Stable nodularity of the  left thyroid lobe. The trachea and esophagus appear normal. Lungs/Pleura: New small left-greater-than-right pleural effusions with associated dependent atelectasis at both lung bases. No confluent airspace opacity, endobronchial lesion or suspicious pulmonary nodule. Upper abdomen:  The visualized upper abdomen appears unremarkable. Musculoskeletal/Chest wall: There is no chest wall mass or suspicious osseous finding. Review of the MIP images confirms the above findings. IMPRESSION: 1. No evidence of acute pulmonary embolism. 2. New moderate to large pericardial effusion without CT evidence of tamponade. This could be symptomatic. Consider further evaluation with echocardiography. 3. New small bilateral pleural effusions with associated bibasilar atelectasis. 4. Stable nodularity of the left thyroid lobe, previously evaluated by ultrasound. 5. These results were called by telephone at the time of interpretation on 03/17/2019 at 2:14 pm to provider ADAM CURATOLO , who verbally acknowledged these results. Electronically Signed   By: WRichardean SaleM.D.   On: 03/17/2019 14:15   EKG: Independently reviewed. normal sinus rhythm, nonspecific ST and T waves changes. Echocardiogram: Pending  I reviewed all nursing notes, pharmacy notes, vitals, pertinent old records.  Assessment/Plan 1. Pericardial effusion Sinus tachycardia Last pericardial effusion seen on CT scan. Etiology is not clear.  Troponins are negative.  EKG shows no evidence of pericarditis.  Patient does not have classic symptoms of pericarditis as well. Currently no evidence of tamponade clinically or on CT scan. Echocardiogram ordered. Discussed with cardiology briefly on the phone, [facility] and close monitoring. Will remain on telemetry. We will initiate work-up with TSH free T4, ANA, ESR, CRP. There is a drop in patient's hemoglobin from March 2020 to today's labs.   Will need to monitor H&H. Currently not reversing patient's anticoagulation. Holding warfarin and transitioning him to IV heparin once INR drops below 1.8. Monitor CBC every 12 hours for now.  2.  Essential hypertension Continuing home medications.  Monitor for hypotension.  3.  Concern for acute on chronic diastolic CHF. While the CT scan shows evidence of groundglass opacities concerning for peripheral edema given the presence of large pericardial effusion and normal blood pressure I will hold off on IV diuresis and monitor.  4.  Type 2 diabetes mellitus Patient is on metformin at home. Hemoglobin A1c 6.5 suggesting controlled diabetes. Currently will hold off on metformin and use sliding scale insulin.  5.  History of DVT PE.  January 2020 Patient is on warfarin at home. Currently we will hold that If indicated we can transition to heparin when appropriate. There is a concern that this may be hemorrhagic pericardial effusion. If the patient's H&H  drops further then he will require reversal of the anticoagulation.   Nutrition: Cardiac diet DVT Prophylaxis: SCD, pharmacological prophylaxis contraindicated due to Large pericardial effusion  Advance goals of care discussion: Full code   Consults: I personally Discussed with cardiology, [facility]  Family Communication: family was present at bedside, at the time of interview.  Opportunity was given to ask question and all questions were answered satisfactorily.  Disposition: Admitted as observation, telemetry unit. Likely to be discharged home, in 1-2 days.  I have discussed plan of care as described above with RN and patient/family.  Severity of Illness: The appropriate patient status for this patient is OBSERVATION. Observation status is judged to be reasonable and necessary in order to provide the required intensity of service to ensure the patient's safety. The patient's presenting symptoms, physical exam findings, and  initial radiographic and laboratory data in the context of their medical condition is felt to place them at decreased risk for further clinical deterioration. Furthermore, it is anticipated that the patient will be medically stable for discharge from the hospital within 2 midnights of admission. The following factors support the patient status of observation.   " The patient's presenting symptoms include tachycardia on electric device. " The physical exam findings include sinus tachycardia, hypertensive. " The initial radiographic and laboratory data are large pericardial effusion.     Author: Berle Mull, MD [facility] 03/17/2019 6:10 PM   To reach On-call, see care teams to locate the attending and reach out to them via [email or web address]. If 7PM-7AM, please contact night-coverage If you still have difficulty reaching the attending provider, please page the [facility] (Director on Call) for [facility] on amion for assistance.

## 2019-03-17 NOTE — ED Provider Notes (Signed)
[facility] DEPT Provider Note   CSN: [id number] Arrival date & time: 03/17/19  1019     History   Chief Complaint Chief Complaint  Patient presents with  . Hypertension    HPI Steven Tapia is a 63 y.o. male.     Patient here after life insurance physical showed hypertension, fast heart rate. Hx of PE on warfarin. Patient here for check up. Denies chest pain, fever, chills. No abdominal pain, no n/v/d.  The history is provided by the patient.  Hypertension This is a chronic problem. The current episode started more than 1 week ago. The problem occurs daily. The problem has not changed since onset.Pertinent negatives include no chest pain, no abdominal pain, no headaches and no shortness of breath. Nothing aggravates the symptoms. Nothing relieves the symptoms. He has tried nothing for the symptoms. The treatment provided no relief.    Past Medical History:  Diagnosis Date  . Diabetes mellitus without complication ([place])   . Hypertension   . PE (pulmonary thromboembolism) [facility])     Patient Active Problem List   Diagnosis Date Noted  . Pericardial effusion 03/17/2019  . Thyroid nodule 08/19/2018  . On continuous oral anticoagulation 06/26/2018  . PE (pulmonary thromboembolism) ([place]) 06/24/2018  . HTN (hypertension) 06/24/2018  . DM2 (diabetes mellitus, type 2) ([place]) 06/24/2018  . Hyperlipidemia 06/24/2018    Past Surgical History:  Procedure Laterality Date  . ACHILLES TENDON REPAIR    . ACHILLES TENDON SURGERY          Home Medications    Prior to Admission medications   Medication Sig Start Date End Date Taking? Authorizing Provider  amLODipine (NORVASC) 10 MG tablet Take 1 tablet (10 mg total) by mouth daily. For BP 06/26/18  Yes Emokpae, Courage, MD  atorvastatin (LIPITOR) 20 MG tablet Take 20 mg by mouth daily.   Yes [provider]  hydrochlorothiazide (HYDRODIURIL) 25 MG tablet Take 25 mg by mouth daily. 03/01/18   Yes [provider]  lisinopril (PRINIVIL,ZESTRIL) 40 MG tablet Take 40 mg by mouth daily.   Yes [provider]  metFORMIN (GLUCOPHAGE) 500 MG tablet Take 1 tablet (500 mg total) by mouth 2 (two) times daily with a meal. Start on 06/28/2018 06/26/18  Yes Emokpae, Courage, MD  warfarin (COUMADIN) 10 MG tablet Take 10 mg by mouth daily. 02/07/19  Yes [provider]  Rivaroxaban 15 & 20 MG TBPK Take as directed on package: Start with one 15mg  tablet by mouth twice a day with food. On Day 22, switch to one 20mg  tablet once a day with food. Patient not taking: Reported on 03/17/2019 08/19/18   Tish Men, MD    Family History Family History  Problem Relation Age of Onset  . Alzheimer's disease Mother   . Clotting disorder Father   . Diabetes Sister     Social History Social History   Tobacco Use  . Smoking status: Never Smoker  . Smokeless tobacco: Never Used  Substance Use Topics  . Alcohol use: Never    Frequency: Never  . Drug use: Never     Allergies   Patient has no known allergies.   Review of Systems Review of Systems  Constitutional: Negative for chills and fever.  HENT: Negative for ear pain and sore throat.   Eyes: Negative for pain and visual disturbance.  Respiratory: Negative for cough and shortness of breath.   Cardiovascular: Negative for chest pain and palpitations.  Gastrointestinal: Negative for abdominal  pain and vomiting.  Genitourinary: Negative for dysuria and hematuria.  Musculoskeletal: Negative for arthralgias and back pain.  Skin: Negative for color change and rash.  Neurological: Negative for seizures, syncope and headaches.  All other systems reviewed and are negative.    Physical Exam Updated Vital Signs  ED Triage Vitals  Enc Vitals Group     BP 03/17/19 1027 (!) 189/97     Pulse Rate 03/17/19 1027 (!) 118     Resp 03/17/19 1027 20     Temp 03/17/19 1027 99.6 F (37.6 C)     Temp Source 03/17/19 1027 Oral      SpO2 03/17/19 1027 98 %     Weight --      Height --      Head Circumference --      Peak Flow --      Pain Score 03/17/19 1036 0     Pain Loc --      Pain Edu? --      Excl. in GC? --     Physical Exam Vitals signs and nursing note reviewed.  Constitutional:      General: He is not in acute distress.    Appearance: He is well-developed. He is not ill-appearing.  HENT:     Head: Normocephalic and atraumatic.     Nose: Nose normal.     Mouth/Throat:     Mouth: Mucous membranes are moist.  Eyes:     Extraocular Movements: Extraocular movements intact.     Conjunctiva/sclera: Conjunctivae normal.     Pupils: Pupils are equal, round, and reactive to light.  Neck:     Musculoskeletal: Neck supple.  Cardiovascular:     Rate and Rhythm: Regular rhythm. Tachycardia present.     Pulses: Normal pulses.     Heart sounds: Normal heart sounds. No murmur.  Pulmonary:     Effort: Pulmonary effort is normal. No respiratory distress.     Breath sounds: Normal breath sounds.  Abdominal:     General: There is no distension.     Palpations: Abdomen is soft.     Tenderness: There is no abdominal tenderness.  Musculoskeletal: Normal range of motion.  Skin:    General: Skin is warm and dry.     Capillary Refill: Capillary refill takes less than 2 seconds.  Neurological:     General: No focal deficit present.     Mental Status: He is alert.  Psychiatric:        Mood and Affect: Mood normal.      ED Treatments / Results  Labs (all labs ordered are listed, but only abnormal results are displayed) Labs Reviewed  BASIC METABOLIC PANEL - Abnormal; Notable for the following components:      Result Value   Glucose, Bld 139 (*)    Calcium 8.7 (*)    All other components within normal limits  CBC - Abnormal; Notable for the following components:   RBC 4.19 (*)    Hemoglobin 11.2 (*)    HCT 34.7 (*)    RDW 16.3 (*)    All other components within normal limits  PROTIME-INR - Abnormal;  Notable for the following components:   Prothrombin Time 27.6 (*)    INR 2.6 (*)    All other components within normal limits  HEPATIC FUNCTION PANEL - Abnormal; Notable for the following components:   ALT 52 (*)    All other components within normal limits  URINALYSIS, ROUTINE W REFLEX MICROSCOPIC -  Abnormal; Notable for the following components:   Glucose, UA 150 (*)    Hgb urine dipstick SMALL (*)    Bacteria, UA RARE (*)    All other components within normal limits  SARS CORONAVIRUS 2 (HOSPITAL ORDER[facility])  LIPASE, BLOOD  TROPONIN I (HIGH SENSITIVITY)  TROPONIN I (HIGH SENSITIVITY)    EKG EKG Interpretation  Date/Time:  Monday March 17 2019 10:49:25 EDT Ventricular Rate:  119 PR Interval:    QRS Duration: 135 QT Interval:  442 QTC Calculation: 622 R Axis:   17 Text Interpretation:  Sinus or ectopic atrial tachycardia Right bundle branch block Confirmed by Virgina NorfolkAdam, Leandra Vanderweele [phone]([id number]) on 03/17/2019 10:51:49 AM   Radiology Dg Chest 2 View  Result Date: 03/17/2019 CLINICAL DATA:  Tachycardia, hypertension EXAM: CHEST - 2 VIEW COMPARISON:  06/24/2018 FINDINGS: Mild cardiomegaly, stable. Mild pulmonary vascular congestion. Patchy bibasilar opacities, left worse than right. No pleural effusion. No pneumothorax. IMPRESSION: 1. Cardiomegaly with mild pulmonary vascular congestion. 2. Bibasilar opacities, left greater than right, which may reflect atelectasis and/or pneumonia in the appropriate clinical setting. Electronically Signed   By: Duanne GuessNicholas  Plundo M.D.   On: 03/17/2019 12:29   Ct Angio Chest Pe W And/or Wo Contrast  Result Date: 03/17/2019 CLINICAL DATA:  Elevated blood pressure. History of diabetes and pulmonary embolism. EXAM: CT ANGIOGRAPHY CHEST WITH CONTRAST TECHNIQUE: Multidetector CT imaging of the chest was performed using the standard protocol during bolus administration of intravenous contrast. Multiplanar CT image reconstructions and  MIPs were obtained to evaluate the vascular anatomy. CONTRAST:  100mL OMNIPAQUE IOHEXOL 350 MG/ML SOLN COMPARISON:  Prior CT 06/24/2018. FINDINGS: Cardiovascular: The pulmonary arteries are well opacified with contrast to the level of the subsegmental branches. There is no evidence of acute pulmonary embolism. The aorta and great vessels demonstrate no significant findings. There is a new moderate to large pericardial effusion. The heart size is normal. There is no significant reflux of contrasted blood into the IVC or hepatic veins. Mediastinum/Nodes: There are no enlarged mediastinal, hilar or axillary lymph nodes.Stable nodularity of the left thyroid lobe. The trachea and esophagus appear normal. Lungs/Pleura: New small left-greater-than-right pleural effusions with associated dependent atelectasis at both lung bases. No confluent airspace opacity, endobronchial lesion or suspicious pulmonary nodule. Upper abdomen:  The visualized upper abdomen appears unremarkable. Musculoskeletal/Chest wall: There is no chest wall mass or suspicious osseous finding. Review of the MIP images confirms the above findings. IMPRESSION: 1. No evidence of acute pulmonary embolism. 2. New moderate to large pericardial effusion without CT evidence of tamponade. This could be symptomatic. Consider further evaluation with echocardiography. 3. New small bilateral pleural effusions with associated bibasilar atelectasis. 4. Stable nodularity of the left thyroid lobe, previously evaluated by ultrasound. 5. These results were called by telephone at the time of interpretation on 03/17/2019 at 2:14 pm to provider Kenadi Miltner , who verbally acknowledged these results. Electronically Signed   By: Carey BullocksWilliam  Veazey M.D.   On: 03/17/2019 14:15    Procedures Procedures (including critical care time)  Medications Ordered in ED Medications  sodium chloride (PF) 0.9 % injection (has no administration in time range)  sodium chloride 0.9 % bolus  1,000 mL (0 mLs Intravenous Stopped 03/17/19 1320)  iohexol (OMNIPAQUE) 350 MG/ML injection 100 mL (100 mLs Intravenous Contrast Given 03/17/19 1349)     Initial Impression / Assessment and Plan / ED Course  I have reviewed the triage vital signs and the nursing notes.  Pertinent labs &  imaging results that were available during my care of the patient were reviewed by me and considered in my medical decision making (see chart for details).     Taiwan Talcott is a 63 year old male with history of diabetes, hypertension, PE on Coumadin who presents the ED with hypertension, tachycardia.  Patient with heart rate between 105 and 115.  Blood pressure is overall unremarkable.  Initially was 190/90 but now 146/90.  Patient breathing well on room air.  Denies any cough, sputum production.  No chest pain, no shortness of breath.  No abdominal pain, nausea, vomiting.  Patient had a life insurance exam several days ago and was told that his blood pressure was high and his heart rate was high.  Patient states that he is on Coumadin for blood clot.  Does not remember his last INR.  Had previously been on Xarelto but insurance would not cover it and he is now on warfarin.  Does not have any signs of volume overload on exam.  Clear breath sounds.  Normal room air oxygenation.  EKG shows sinus tachycardia with right bundle branch block.  He has T wave inversions inferiorly and laterally but EKG overall appears unchanged from prior EKG.  No active chest pain.  Will evaluate with lab work including troponin, chest x-ray, CT scan to evaluate for PE.  Troponin within normal limits.  No significant anemia, electrolyte abnormality, kidney injury.  No leukocytosis.  CT scan of the chest as discussed on the phone to me by radiology shows moderate pericardial effusion.  No evidence of [place] nod.  Patient also has bilateral pleural effusions.  Overall tachycardia likely from pericardial effusion.  However, tachycardia did improve  with IV fluids.  Patient to be admitted to medicine for echocardiogram and further work-up for pericardial effusion.  Coronavirus test has been ordered as well.  Hemodynamically stable throughout my care.  This chart was dictated using voice recognition software.  Despite best efforts to proofread,  errors can occur which can change the documentation meaning.   Final Clinical Impressions(s) / ED Diagnoses   Final diagnoses:  Pericardial effusion  Tachycardia    ED Discharge Orders    None       Virgina Norfolk, DO 03/17/19 1553

## 2019-03-17 NOTE — Progress Notes (Signed)
ANTICOAGULATION CONSULT NOTE - Initial Consult  Pharmacy Consult for Warfarin Indication: Hx pulmonary embolus  No Known Allergies  Patient Measurements:    Vital Signs: Temp: 98.6 F (37 C) (09/28 1319) Temp Source: Oral (09/28 1319) BP: 149/89 (09/28 1700) Pulse Rate: 100 (09/28 1700)  Labs: Recent Labs    03/17/19 1039 03/17/19 1239  HGB 11.2*  --   HCT 34.7*  --   PLT 316  --   LABPROT 27.6*  --   INR 2.6*  --   CREATININE 1.03  --   TROPONINIHS 3 3    CrCl cannot be calculated (Unknown ideal weight.).   Medical History: Past Medical History:  Diagnosis Date  . Diabetes mellitus without complication ([place])   . Hypertension   . PE (pulmonary thromboembolism) ([facility])     Medications:  Coumadin 10mg  daily PTA- LD 9/28 @ 0900  Assessment: 62 yo M on chronic Coumadin for hx PE.  INR therapeutic on admission.  No reports of bleeding.  Patient has already taken dose today.  Goal of Therapy:  INR 2-3 Heparin level 0.3-0.7 units/ml Monitor platelets by anticoagulation protocol: Yes   Plan:  Daily INR  Netta Cedars, PharmD, BCPS 03/17/2019,6:03 PM

## 2019-03-17 NOTE — ED Notes (Signed)
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 Currently, he denies any chest pain, shortness of breath, lower extremity swelling, palpitations, dizziness, or lightheadedness.

## 2019-03-17 NOTE — ED Notes (Signed)
Patient in CT

## 2019-03-17 NOTE — ED Triage Notes (Signed)
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

## 2019-03-17 NOTE — ED Notes (Signed)
Patient has been transported to [facility] via [facility].

## 2019-03-18 ENCOUNTER — Encounter (HOSPITAL_COMMUNITY)
Admission: EM | Disposition: A | Discharge status: Home / Self Care | Payer: Self-pay | Source: Home / Self Care | Attending: Cardiovascular Disease

## 2019-03-18 ENCOUNTER — Inpatient Hospital Stay (HOSPITAL_COMMUNITY): Payer: BC Managed Care – PPO | Admitting: Certified Registered Nurse Anesthetist

## 2019-03-18 ENCOUNTER — Inpatient Hospital Stay (HOSPITAL_COMMUNITY): Payer: BC Managed Care – PPO

## 2019-03-18 ENCOUNTER — Observation Stay (HOSPITAL_BASED_OUTPATIENT_CLINIC_OR_DEPARTMENT_OTHER): Payer: BC Managed Care – PPO

## 2019-03-18 ENCOUNTER — Inpatient Hospital Stay (HOSPITAL_COMMUNITY)
Admission: EM | Disposition: A | Discharge status: Home / Self Care | Payer: Self-pay | Source: Home / Self Care | Attending: Cardiovascular Disease

## 2019-03-18 ENCOUNTER — Other Ambulatory Visit: Payer: Self-pay

## 2019-03-18 ENCOUNTER — Encounter (HOSPITAL_COMMUNITY): Payer: Self-pay | Admitting: Surgery

## 2019-03-18 DIAGNOSIS — I313 Pericardial effusion (noninflammatory): Secondary | ICD-10-CM

## 2019-03-18 DIAGNOSIS — R0602 Shortness of breath: Secondary | ICD-10-CM | POA: Diagnosis present

## 2019-03-18 DIAGNOSIS — I10 Essential (primary) hypertension: Secondary | ICD-10-CM | POA: Diagnosis not present

## 2019-03-18 DIAGNOSIS — Z6836 Body mass index (BMI) 36.0-36.9, adult: Secondary | ICD-10-CM | POA: Diagnosis not present

## 2019-03-18 DIAGNOSIS — I480 Paroxysmal atrial fibrillation: Secondary | ICD-10-CM | POA: Diagnosis not present

## 2019-03-18 DIAGNOSIS — Z7984 Long term (current) use of oral hypoglycemic drugs: Secondary | ICD-10-CM | POA: Diagnosis not present

## 2019-03-18 DIAGNOSIS — Z86711 Personal history of pulmonary embolism: Secondary | ICD-10-CM | POA: Diagnosis not present

## 2019-03-18 DIAGNOSIS — E785 Hyperlipidemia, unspecified: Secondary | ICD-10-CM | POA: Diagnosis not present

## 2019-03-18 DIAGNOSIS — Z79899 Other long term (current) drug therapy: Secondary | ICD-10-CM | POA: Diagnosis not present

## 2019-03-18 DIAGNOSIS — Z20828 Contact with and (suspected) exposure to other viral communicable diseases: Secondary | ICD-10-CM | POA: Diagnosis not present

## 2019-03-18 DIAGNOSIS — Z7901 Long term (current) use of anticoagulants: Secondary | ICD-10-CM | POA: Diagnosis not present

## 2019-03-18 DIAGNOSIS — I5033 Acute on chronic diastolic (congestive) heart failure: Secondary | ICD-10-CM | POA: Diagnosis not present

## 2019-03-18 DIAGNOSIS — D62 Acute posthemorrhagic anemia: Secondary | ICD-10-CM | POA: Diagnosis not present

## 2019-03-18 DIAGNOSIS — Z833 Family history of diabetes mellitus: Secondary | ICD-10-CM | POA: Diagnosis not present

## 2019-03-18 DIAGNOSIS — J90 Pleural effusion, not elsewhere classified: Secondary | ICD-10-CM | POA: Diagnosis not present

## 2019-03-18 DIAGNOSIS — Z86718 Personal history of other venous thrombosis and embolism: Secondary | ICD-10-CM | POA: Diagnosis not present

## 2019-03-18 DIAGNOSIS — E119 Type 2 diabetes mellitus without complications: Secondary | ICD-10-CM | POA: Diagnosis not present

## 2019-03-18 DIAGNOSIS — I314 Cardiac tamponade: Secondary | ICD-10-CM | POA: Diagnosis not present

## 2019-03-18 DIAGNOSIS — R000 Tachycardia, unspecified: Secondary | ICD-10-CM | POA: Diagnosis not present

## 2019-03-18 DIAGNOSIS — E669 Obesity, unspecified: Secondary | ICD-10-CM | POA: Diagnosis not present

## 2019-03-18 DIAGNOSIS — G4733 Obstructive sleep apnea (adult) (pediatric): Secondary | ICD-10-CM | POA: Diagnosis not present

## 2019-03-18 DIAGNOSIS — I4510 Unspecified right bundle-branch block: Secondary | ICD-10-CM | POA: Diagnosis not present

## 2019-03-18 DIAGNOSIS — T45515A Adverse effect of anticoagulants, initial encounter: Secondary | ICD-10-CM | POA: Diagnosis not present

## 2019-03-18 HISTORY — PX: PERICARDIOCENTESIS: CATH118255

## 2019-03-18 HISTORY — PX: VIDEO ASSISTED THORACOSCOPY (VATS)/WEDGE RESECTION: SHX6174

## 2019-03-18 LAB — COMPREHENSIVE METABOLIC PANEL
ALT: 43 U/L (ref 0–44)
AST: 21 U/L (ref 15–41)
Albumin: 3 g/dL — ABNORMAL LOW (ref 3.5–5.0)
Alkaline Phosphatase: 82 U/L (ref 38–126)
Anion gap: 7 (ref 5–15)
BUN: 16 mg/dL (ref 8–23)
CO2: 27 mmol/L (ref 22–32)
Calcium: 8.6 mg/dL — ABNORMAL LOW (ref 8.9–10.3)
Chloride: 100 mmol/L (ref 98–111)
Creatinine, Ser: 0.94 mg/dL (ref 0.61–1.24)
GFR calc Af Amer: >60 mL/min (ref >60)
GFR calc non Af Amer: >60 mL/min (ref >60)
Glucose, Bld: 118 mg/dL — ABNORMAL HIGH (ref 70–99)
Potassium: 3.8 mmol/L (ref 3.5–5.1)
Sodium: 134 mmol/L — ABNORMAL LOW (ref 135–145)
Total Bilirubin: 0.9 mg/dL (ref 0.3–1.2)
Total Protein: 6.8 g/dL (ref 6.5–8.1)

## 2019-03-18 LAB — CBC
HCT: 31.7 % — ABNORMAL LOW (ref 39.0–52.0)
HCT: 29.5 % — ABNORMAL LOW (ref 39.0–52.0)
Hemoglobin: 10.3 g/dL — ABNORMAL LOW (ref 13.0–17.0)
Hemoglobin: 9.3 g/dL — ABNORMAL LOW (ref 13.0–17.0)
MCH: 26.6 pg (ref 26.0–34.0)
MCH: 26.3 pg (ref 26.0–34.0)
MCHC: 32.5 g/dL (ref 30.0–36.0)
MCHC: 31.5 g/dL (ref 30.0–36.0)
MCV: 81.9 fL (ref 80.0–100.0)
MCV: 83.3 fL (ref 80.0–100.0)
Platelets: 317 10*3/uL (ref 150–400)
Platelets: 276 10*3/uL (ref 150–400)
RBC: 3.87 MIL/uL — ABNORMAL LOW (ref 4.22–5.81)
RBC: 3.54 MIL/uL — ABNORMAL LOW (ref 4.22–5.81)
RDW: 16.5 % — ABNORMAL HIGH (ref 11.5–15.5)
RDW: 16.4 % — ABNORMAL HIGH (ref 11.5–15.5)
WBC: 4.4 10*3/uL (ref 4.0–10.5)
WBC: 5.5 10*3/uL (ref 4.0–10.5)
nRBC: 0 % (ref 0.0–0.2)
nRBC: 0 % (ref 0.0–0.2)

## 2019-03-18 LAB — HEMOGLOBIN A1C
Hgb A1c MFr Bld: 6.6 % — ABNORMAL HIGH (ref 4.8–5.6)
Mean Plasma Glucose: 142.72 mg/dL

## 2019-03-18 LAB — PROTIME-INR
INR: 2.6 — ABNORMAL HIGH (ref 0.8–1.2)
INR: 1.6 — ABNORMAL HIGH (ref 0.8–1.2)
Prothrombin Time: 27.7 seconds — ABNORMAL HIGH (ref 11.4–15.2)
Prothrombin Time: 18.7 seconds — ABNORMAL HIGH (ref 11.4–15.2)

## 2019-03-18 LAB — ECHOCARDIOGRAM LIMITED
Height: 76 in
Weight: 4617.6 oz

## 2019-03-18 LAB — GLUCOSE, CAPILLARY
Glucose-Capillary: 115 mg/dL — ABNORMAL HIGH (ref 70–99)
Glucose-Capillary: 116 mg/dL — ABNORMAL HIGH (ref 70–99)
Glucose-Capillary: 136 mg/dL — ABNORMAL HIGH (ref 70–99)
Glucose-Capillary: 153 mg/dL — ABNORMAL HIGH (ref 70–99)
Glucose-Capillary: 98 mg/dL (ref 70–99)

## 2019-03-18 LAB — ABO/RH: ABO/RH(D): A POS

## 2019-03-18 LAB — LACTATE DEHYDROGENASE: LDH: 127 U/L (ref 98–192)

## 2019-03-18 LAB — ECHOCARDIOGRAM COMPLETE
Height: 76 in
Weight: 4617.6 oz

## 2019-03-18 LAB — BRAIN NATRIURETIC PEPTIDE: B Natriuretic Peptide: 13.8 pg/mL (ref 0.0–100.0)

## 2019-03-18 LAB — T4, FREE: Free T4: 1.28 ng/dL — ABNORMAL HIGH (ref 0.61–1.12)

## 2019-03-18 LAB — SEDIMENTATION RATE: Sed Rate: 35 mm/hr — ABNORMAL HIGH (ref 0–16)

## 2019-03-18 SURGERY — VIDEO ASSISTED THORACOSCOPY (VATS)/WEDGE RESECTION
Anesthesia: General | Site: Chest | Laterality: Right

## 2019-03-18 SURGERY — PERICARDIOCENTESIS
Anesthesia: LOCAL

## 2019-03-18 MED ORDER — ACETAMINOPHEN 325 MG PO TABS: 650.0000 mg | ORAL_TABLET | ORAL | Status: DC | PRN

## 2019-03-18 MED ORDER — BUPIVACAINE HCL 0.5 % IJ SOLN
INTRAMUSCULAR | Status: DC | PRN
  Administered 2019-03-18: 30 mL

## 2019-03-18 MED ORDER — ESMOLOL HCL 100 MG/10ML IV SOLN
INTRAVENOUS | Status: DC | PRN
  Administered 2019-03-18: 50 mg via INTRAVENOUS

## 2019-03-18 MED ORDER — CEFAZOLIN SODIUM-DEXTROSE 2-4 GM/100ML-% IV SOLN
2.0000 g | Freq: Three times a day (TID) | INTRAVENOUS | Status: AC
  Administered 2019-03-19 (×2): 2 g via INTRAVENOUS
  Filled 2019-03-18 (×3): qty 100

## 2019-03-18 MED ORDER — HEPARIN (PORCINE) IN NACL 1000-0.9 UT/500ML-% IV SOLN
INTRAVENOUS | Status: DC | PRN
  Administered 2019-03-18: 500 mL

## 2019-03-18 MED ORDER — HEPARIN (PORCINE) IN NACL 1000-0.9 UT/500ML-% IV SOLN
INTRAVENOUS | Status: AC
  Filled 2019-03-18: qty 500

## 2019-03-18 MED ORDER — LIDOCAINE HCL (PF) 1 % IJ SOLN
INTRAMUSCULAR | Status: DC | PRN
  Administered 2019-03-18: 15 mL

## 2019-03-18 MED ORDER — PROPOFOL 10 MG/ML IV BOLUS
INTRAVENOUS | Status: DC | PRN
  Administered 2019-03-18: 150 mg via INTRAVENOUS
  Administered 2019-03-18: 50 mg via INTRAVENOUS

## 2019-03-18 MED ORDER — LIDOCAINE 2% (20 MG/ML) 5 ML SYRINGE
INTRAMUSCULAR | Status: DC | PRN
  Administered 2019-03-18: 60 mg via INTRAVENOUS

## 2019-03-18 MED ORDER — SODIUM CHLORIDE 0.9 % IV SOLN: 250.0000 mL | INTRAVENOUS | Status: DC | PRN

## 2019-03-18 MED ORDER — PROMETHAZINE HCL 25 MG/ML IJ SOLN: 6.2500 mg | INTRAMUSCULAR | Status: DC | PRN

## 2019-03-18 MED ORDER — SODIUM CHLORIDE 0.9% IV SOLUTION
Freq: Once | INTRAVENOUS | Status: AC
  Administered 2019-03-18: 11:00:00 via INTRAVENOUS

## 2019-03-18 MED ORDER — FENTANYL CITRATE (PF) 100 MCG/2ML IJ SOLN
INTRAMUSCULAR | Status: AC
  Filled 2019-03-18: qty 2

## 2019-03-18 MED ORDER — ROCURONIUM BROMIDE 10 MG/ML (PF) SYRINGE
PREFILLED_SYRINGE | INTRAVENOUS | Status: DC | PRN
  Administered 2019-03-18: 70 mg via INTRAVENOUS

## 2019-03-18 MED ORDER — TRAMADOL HCL 50 MG PO TABS: 50.0000 mg | ORAL_TABLET | Freq: Four times a day (QID) | ORAL | Status: DC | PRN

## 2019-03-18 MED ORDER — MIDAZOLAM HCL 2 MG/2ML IJ SOLN
INTRAMUSCULAR | Status: AC
  Filled 2019-03-18: qty 2

## 2019-03-18 MED ORDER — AMIODARONE HCL IN DEXTROSE 360-4.14 MG/200ML-% IV SOLN
INTRAVENOUS | Status: DC | PRN
  Administered 2019-03-18: 60 mg/h via INTRAVENOUS

## 2019-03-18 MED ORDER — SUGAMMADEX SODIUM 200 MG/2ML IV SOLN
INTRAVENOUS | Status: DC | PRN
  Administered 2019-03-18: 200 mg via INTRAVENOUS

## 2019-03-18 MED ORDER — FENTANYL CITRATE (PF) 100 MCG/2ML IJ SOLN
INTRAMUSCULAR | Status: DC | PRN
  Administered 2019-03-18 (×3): 25 ug via INTRAVENOUS

## 2019-03-18 MED ORDER — LIDOCAINE HCL (PF) 1 % IJ SOLN
INTRAMUSCULAR | Status: AC
  Filled 2019-03-18: qty 30

## 2019-03-18 MED ORDER — VITAMIN K1 10 MG/ML IJ SOLN
5.0000 mg | Freq: Once | INTRAVENOUS | Status: AC
  Administered 2019-03-18: 5 mg via INTRAVENOUS
  Filled 2019-03-18: qty 0.5

## 2019-03-18 MED ORDER — DEXTROSE 5 % IV SOLN
INTRAVENOUS | Status: DC | PRN
  Administered 2019-03-18: 3 g via INTRAVENOUS

## 2019-03-18 MED ORDER — MEPERIDINE HCL 25 MG/ML IJ SOLN: 6.2500 mg | INTRAMUSCULAR | Status: DC | PRN

## 2019-03-18 MED ORDER — AMIODARONE HCL IN DEXTROSE 360-4.14 MG/200ML-% IV SOLN
30.0000 mg/h | INTRAVENOUS | Status: DC
  Administered 2019-03-19 – 2019-03-20 (×3): 30 mg/h via INTRAVENOUS
  Filled 2019-03-18 (×2): qty 200

## 2019-03-18 MED ORDER — AMIODARONE HCL IN DEXTROSE 360-4.14 MG/200ML-% IV SOLN
60.0000 mg/h | INTRAVENOUS | Status: DC
  Administered 2019-03-18: 23:00:00 60 mg/h via INTRAVENOUS
  Filled 2019-03-18 (×2): qty 200

## 2019-03-18 MED ORDER — BUPIVACAINE HCL (PF) 0.5 % IJ SOLN
INTRAMUSCULAR | Status: AC
  Filled 2019-03-18: qty 30

## 2019-03-18 MED ORDER — FENTANYL CITRATE (PF) 100 MCG/2ML IJ SOLN
INTRAMUSCULAR | Status: AC
  Administered 2019-03-18: 50 ug via INTRAVENOUS
  Filled 2019-03-18: qty 2

## 2019-03-18 MED ORDER — MIDAZOLAM HCL 2 MG/2ML IJ SOLN: 0.5000 mg | Freq: Once | INTRAMUSCULAR | Status: DC | PRN

## 2019-03-18 MED ORDER — BISACODYL 5 MG PO TBEC
10.0000 mg | DELAYED_RELEASE_TABLET | Freq: Every day | ORAL | Status: DC
  Administered 2019-03-19 – 2019-03-22 (×4): 10 mg via ORAL
  Filled 2019-03-18 (×4): qty 2

## 2019-03-18 MED ORDER — PHENYLEPHRINE 40 MCG/ML (10ML) SYRINGE FOR IV PUSH (FOR BLOOD PRESSURE SUPPORT)
PREFILLED_SYRINGE | INTRAVENOUS | Status: DC | PRN
  Administered 2019-03-18 (×2): 80 ug via INTRAVENOUS

## 2019-03-18 MED ORDER — BUPIVACAINE LIPOSOME 1.3 % IJ SUSP
20.0000 mL | INTRAMUSCULAR | Status: DC
  Filled 2019-03-18 (×2): qty 20

## 2019-03-18 MED ORDER — MIDAZOLAM HCL 2 MG/2ML IJ SOLN: 2.0000 mg | Freq: Once | INTRAMUSCULAR | Status: DC

## 2019-03-18 MED ORDER — LACTATED RINGERS IV SOLN
INTRAVENOUS | Status: DC | PRN
  Administered 2019-03-18: 19:00:00 via INTRAVENOUS

## 2019-03-18 MED ORDER — SENNOSIDES-DOCUSATE SODIUM 8.6-50 MG PO TABS
1.0000 | ORAL_TABLET | Freq: Every day | ORAL | Status: DC
  Administered 2019-03-20 – 2019-03-21 (×2): 1 via ORAL
  Filled 2019-03-18 (×2): qty 1

## 2019-03-18 MED ORDER — BUPIVACAINE HCL 0.5 % IJ SOLN
INTRAMUSCULAR | Status: DC | PRN
  Administered 2019-03-18: 10 mL

## 2019-03-18 MED ORDER — POTASSIUM CHLORIDE IN NACL 20-0.9 MEQ/L-% IV SOLN
INTRAVENOUS | Status: DC
  Administered 2019-03-19: 100 mL/h via INTRAVENOUS
  Administered 2019-03-19: 10 mL/h via INTRAVENOUS
  Filled 2019-03-18 (×2): qty 1000

## 2019-03-18 MED ORDER — MORPHINE SULFATE (PF) 2 MG/ML IV SOLN: 2.0000 mg | INTRAVENOUS | Status: DC | PRN

## 2019-03-18 MED ORDER — MIDAZOLAM HCL 2 MG/2ML IJ SOLN
INTRAMUSCULAR | Status: AC
  Administered 2019-03-18: 19:00:00 1 mg via INTRAVENOUS
  Filled 2019-03-18: qty 2

## 2019-03-18 MED ORDER — FENTANYL CITRATE (PF) 250 MCG/5ML IJ SOLN
INTRAMUSCULAR | Status: AC
  Filled 2019-03-18: qty 5

## 2019-03-18 MED ORDER — HYDRALAZINE HCL 20 MG/ML IJ SOLN: 10.0000 mg | INTRAMUSCULAR | Status: AC | PRN

## 2019-03-18 MED ORDER — SODIUM CHLORIDE 0.9% FLUSH: 3.0000 mL | INTRAVENOUS | Status: DC | PRN

## 2019-03-18 MED ORDER — ONDANSETRON HCL 4 MG/2ML IJ SOLN
4.0000 mg | Freq: Four times a day (QID) | INTRAMUSCULAR | Status: DC | PRN
  Administered 2019-03-18: 20:00:00 4 mg via INTRAVENOUS

## 2019-03-18 MED ORDER — SODIUM CHLORIDE 0.9 % IV SOLN: INTRAVENOUS | Status: AC

## 2019-03-18 MED ORDER — MIDAZOLAM HCL 2 MG/2ML IJ SOLN
1.0000 mg | Freq: Once | INTRAMUSCULAR | Status: AC
  Administered 2019-03-18: 19:00:00 1 mg via INTRAVENOUS

## 2019-03-18 MED ORDER — SODIUM CHLORIDE 0.9% FLUSH
3.0000 mL | Freq: Two times a day (BID) | INTRAVENOUS | Status: DC
  Administered 2019-03-19 – 2019-03-21 (×6): 3 mL via INTRAVENOUS

## 2019-03-18 MED ORDER — KETOROLAC TROMETHAMINE 15 MG/ML IJ SOLN
15.0000 mg | Freq: Four times a day (QID) | INTRAMUSCULAR | Status: AC
  Administered 2019-03-19 – 2019-03-20 (×8): 15 mg via INTRAVENOUS
  Filled 2019-03-18 (×8): qty 1

## 2019-03-18 MED ORDER — INSULIN ASPART 100 UNIT/ML ~~LOC~~ SOLN
0.0000 [IU] | Freq: Four times a day (QID) | SUBCUTANEOUS | Status: DC
  Administered 2019-03-19 (×2): 2 [IU] via SUBCUTANEOUS

## 2019-03-18 MED ORDER — DEXAMETHASONE SODIUM PHOSPHATE 10 MG/ML IJ SOLN
INTRAMUSCULAR | Status: DC | PRN
  Administered 2019-03-18: 4 mg via INTRAVENOUS

## 2019-03-18 MED ORDER — AMIODARONE IV BOLUS ONLY 150 MG/100ML
INTRAVENOUS | Status: DC | PRN
  Administered 2019-03-18: 150 mg via INTRAVENOUS

## 2019-03-18 MED ORDER — LABETALOL HCL 5 MG/ML IV SOLN: 10.0000 mg | INTRAVENOUS | Status: AC | PRN

## 2019-03-18 MED ORDER — FENTANYL CITRATE (PF) 100 MCG/2ML IJ SOLN: 25.0000 ug | INTRAMUSCULAR | Status: DC | PRN

## 2019-03-18 MED ORDER — CHLORHEXIDINE GLUCONATE CLOTH 2 % EX PADS
6.0000 | MEDICATED_PAD | Freq: Every day | CUTANEOUS | Status: DC
  Administered 2019-03-19 – 2019-03-20 (×2): 6 via TOPICAL

## 2019-03-18 MED ORDER — PROPOFOL 10 MG/ML IV BOLUS
INTRAVENOUS | Status: AC
  Filled 2019-03-18: qty 20

## 2019-03-18 MED ORDER — FENTANYL CITRATE (PF) 100 MCG/2ML IJ SOLN
50.0000 ug | Freq: Once | INTRAMUSCULAR | Status: AC
  Administered 2019-03-18: 19:00:00 50 ug via INTRAVENOUS

## 2019-03-18 MED ORDER — SODIUM CHLORIDE 0.9 % IV SOLN: INTRAVENOUS | Status: DC | PRN

## 2019-03-18 MED ORDER — 0.9 % SODIUM CHLORIDE (POUR BTL) OPTIME
TOPICAL | Status: DC | PRN
  Administered 2019-03-18: 1000 mL

## 2019-03-18 MED ORDER — SODIUM CHLORIDE 0.9 % IV SOLN
INTRAVENOUS | Status: AC | PRN
  Administered 2019-03-18: 250 mL via INTRAVENOUS

## 2019-03-18 MED ORDER — FENTANYL CITRATE (PF) 250 MCG/5ML IJ SOLN
INTRAMUSCULAR | Status: DC | PRN
  Administered 2019-03-18: 50 ug via INTRAVENOUS
  Administered 2019-03-18: 150 ug via INTRAVENOUS
  Administered 2019-03-18 (×2): 50 ug via INTRAVENOUS

## 2019-03-18 MED ORDER — MIDAZOLAM HCL 2 MG/2ML IJ SOLN
INTRAMUSCULAR | Status: DC | PRN
  Administered 2019-03-18 (×2): 1 mg via INTRAVENOUS

## 2019-03-18 SURGICAL SUPPLY — 92 items
APPLICATOR COTTON TIP 6 STRL (MISCELLANEOUS) ×1 IMPLANT
APPLICATOR COTTON TIP 6IN STRL (MISCELLANEOUS) ×2
APPLIER CLIP ROT 10 11.4 M/L (STAPLE)
BLADE CLIPPER SURG (BLADE) ×2 IMPLANT
CANISTER SUCT 3000ML PPV (MISCELLANEOUS) ×2 IMPLANT
CATH ROBINSON RED A/P 14FR (CATHETERS) IMPLANT
CATH THORACIC 28FR (CATHETERS) IMPLANT
CATH THORACIC 28FR RT ANG (CATHETERS) IMPLANT
CATH THORACIC 36FR (CATHETERS) IMPLANT
CATH THORACIC 36FR RT ANG (CATHETERS) IMPLANT
CATH TROCAR 20FR (CATHETERS) IMPLANT
CLIP APPLIE ROT 10 11.4 M/L (STAPLE) IMPLANT
CLIP VESOCCLUDE MED 6/CT (CLIP) IMPLANT
CONN ST 1/4X3/8  BEN (MISCELLANEOUS)
CONN ST 1/4X3/8 BEN (MISCELLANEOUS) IMPLANT
CONN Y 3/8X3/8X3/8  BEN (MISCELLANEOUS)
CONN Y 3/8X3/8X3/8 BEN (MISCELLANEOUS) IMPLANT
CONT SPEC 4OZ CLIKSEAL STRL BL (MISCELLANEOUS) ×4 IMPLANT
COVER SURGICAL LIGHT HANDLE (MISCELLANEOUS) IMPLANT
COVER WAND RF STERILE (DRAPES) IMPLANT
DERMABOND ADVANCED (GAUZE/BANDAGES/DRESSINGS) ×1
DERMABOND ADVANCED .7 DNX12 (GAUZE/BANDAGES/DRESSINGS) ×1 IMPLANT
DISSECTOR BLUNT TIP ENDO 5MM (MISCELLANEOUS) IMPLANT
DRAIN CHANNEL 19F RND (DRAIN) ×2 IMPLANT
DRAIN CHANNEL 28F RND 3/8 FF (WOUND CARE) IMPLANT
DRAIN CHANNEL 32F RND 10.7 FF (WOUND CARE) IMPLANT
DRAIN CONNECTOR BLAKE 1:1 (MISCELLANEOUS) ×2 IMPLANT
DRAPE WARM FLUID 44X44 (DRAPES) IMPLANT
ELECT BLADE 6.5 EXT (BLADE) ×2 IMPLANT
ELECT REM PT RETURN 9FT ADLT (ELECTROSURGICAL) ×2
ELECTRODE REM PT RTRN 9FT ADLT (ELECTROSURGICAL) ×1 IMPLANT
GAUZE SPONGE 4X4 12PLY STRL (GAUZE/BANDAGES/DRESSINGS) ×2 IMPLANT
GLOVE BIO SURGEON STRL SZ7 (GLOVE) ×2 IMPLANT
GOWN STRL REUS W/ TWL LRG LVL3 (GOWN DISPOSABLE) ×2 IMPLANT
GOWN STRL REUS W/ TWL XL LVL3 (GOWN DISPOSABLE) ×1 IMPLANT
GOWN STRL REUS W/TWL LRG LVL3 (GOWN DISPOSABLE) ×2
GOWN STRL REUS W/TWL XL LVL3 (GOWN DISPOSABLE) ×1
HANDLE STAPLE ENDO GIA SHORT (STAPLE) ×1
HEMOSTAT SURGICEL 2X14 (HEMOSTASIS) IMPLANT
KIT BASIN OR (CUSTOM PROCEDURE TRAY) ×2 IMPLANT
KIT SUCTION CATH 14FR (SUCTIONS) IMPLANT
KIT TURNOVER KIT B (KITS) ×2 IMPLANT
NEEDLE 22X1 1/2 (OR ONLY) (NEEDLE) ×2 IMPLANT
NEEDLE SPNL 18GX3.5 QUINCKE PK (NEEDLE) IMPLANT
NS IRRIG 1000ML POUR BTL (IV SOLUTION) ×4 IMPLANT
PACK CHEST (CUSTOM PROCEDURE TRAY) ×2 IMPLANT
PACK UNIVERSAL I (CUSTOM PROCEDURE TRAY) ×2 IMPLANT
POUCH ENDO CATCH II 15MM (MISCELLANEOUS) IMPLANT
POUCH SPECIMEN RETRIEVAL 10MM (ENDOMECHANICALS) IMPLANT
SCISSORS LAP 5X35 DISP (ENDOMECHANICALS) IMPLANT
SEALANT PROGEL (MISCELLANEOUS) IMPLANT
SEALANT SURG COSEAL 4ML (VASCULAR PRODUCTS) IMPLANT
SEALANT SURG COSEAL 8ML (VASCULAR PRODUCTS) IMPLANT
SEALER LIGASURE MARYLAND 30 (ELECTROSURGICAL) ×2 IMPLANT
SET IRRIG TUBING LAPAROSCOPIC (IRRIGATION / IRRIGATOR) IMPLANT
SOL ANTI FOG 6CC (MISCELLANEOUS) ×1 IMPLANT
SOLUTION ANTI FOG 6CC (MISCELLANEOUS) ×1
SPECIMEN JAR MEDIUM (MISCELLANEOUS) IMPLANT
SPONGE TONSIL TAPE 1 RFD (DISPOSABLE) ×2 IMPLANT
STAPLER ENDO GIA 12MM SHORT (STAPLE) ×1 IMPLANT
STOPCOCK 4 WAY LG BORE MALE ST (IV SETS) IMPLANT
SUT MNCRL AB 3-0 PS2 18 (SUTURE) IMPLANT
SUT MON AB 2-0 CT1 36 (SUTURE) IMPLANT
SUT PDS AB 1 CTX 36 (SUTURE) IMPLANT
SUT PROLENE 4 0 RB 1 (SUTURE)
SUT PROLENE 4-0 RB1 .5 CRCL 36 (SUTURE) IMPLANT
SUT SILK  1 MH (SUTURE) ×1
SUT SILK 1 MH (SUTURE) ×1 IMPLANT
SUT SILK 1 TIES 10X30 (SUTURE) IMPLANT
SUT SILK 2 0 SH (SUTURE) IMPLANT
SUT SILK 2 0SH CR/8 30 (SUTURE) IMPLANT
SUT VIC AB 1 CTX 36 (SUTURE)
SUT VIC AB 1 CTX36XBRD ANBCTR (SUTURE) IMPLANT
SUT VIC AB 3-0 SH 27 (SUTURE) ×1
SUT VIC AB 3-0 SH 27X BRD (SUTURE) ×1 IMPLANT
SUT VIC AB 3-0 X1 27 (SUTURE) ×2 IMPLANT
SUT VICRYL 0 UR6 27IN ABS (SUTURE) IMPLANT
SUT VICRYL 2 TP 1 (SUTURE) IMPLANT
SYR 10ML LL (SYRINGE) ×2 IMPLANT
SYR 30ML LL (SYRINGE) IMPLANT
SYSTEM SAHARA CHEST DRAIN ATS (WOUND CARE) ×2 IMPLANT
TAPE CLOTH 4X10 WHT NS (GAUZE/BANDAGES/DRESSINGS) ×2 IMPLANT
TAPE CLOTH SURG 4X10 WHT LF (GAUZE/BANDAGES/DRESSINGS) ×2 IMPLANT
TIP APPLICATOR SPRAY EXTEND 16 (VASCULAR PRODUCTS) IMPLANT
TOWEL GREEN STERILE (TOWEL DISPOSABLE) ×2 IMPLANT
TOWEL GREEN STERILE FF (TOWEL DISPOSABLE) IMPLANT
TRAP SPECIMEN MUCOUS 40CC (MISCELLANEOUS) ×4 IMPLANT
TRAY FOLEY MTR SLVR 16FR STAT (SET/KITS/TRAYS/PACK) IMPLANT
TROCAR XCEL BLADELESS 5X75MML (TROCAR) ×2 IMPLANT
TUBING EXTENTION W/L.L. (IV SETS) ×2 IMPLANT
TUBING LAP HI FLOW INSUFFLATIO (TUBING) ×2 IMPLANT
WATER STERILE IRR 1000ML POUR (IV SOLUTION) ×2 IMPLANT

## 2019-03-18 SURGICAL SUPPLY — 8 items
PACK CARDIAC CATHETERIZATION (CUSTOM PROCEDURE TRAY) ×2 IMPLANT
PERIVAC PERICARDIOCENTESIS 8.3 (TRAY / TRAY PROCEDURE) ×2 IMPLANT
PROTECTION STATION PRESSURIZED (MISCELLANEOUS) ×2
STATION PROTECTION PRESSURIZED (MISCELLANEOUS) ×1 IMPLANT
TRANSDUCER W/STOPCOCK (MISCELLANEOUS) ×2 IMPLANT
TUBING ART PRESS 72  MALE/FEM (TUBING) ×1
TUBING ART PRESS 72 MALE/FEM (TUBING) ×1 IMPLANT
WIRE EMERALD 3MM-J .035X150CM (WIRE) ×4 IMPLANT

## 2019-03-18 NOTE — Transfer of Care (Signed)
Immediate Anesthesia Transfer of Care Note  Patient: Steven Tapia  Procedure(s) Performed: RIGHT VIDEO ASSISTED THORACOSCOPY (VATS) PERICARDIAL WINDOW (Right Chest)  Patient Location: PACU  Anesthesia Type:General  Level of Consciousness: awake  Airway & Oxygen Therapy: Patient Spontanous Breathing and Patient connected to face mask oxygen  Post-op Assessment: Report given to RN and Post -op Vital signs reviewed and stable  Post vital signs: Reviewed and stable  Last Vitals:  Vitals Value Taken Time  BP 120/62 03/18/19 2106  Temp 37.2 C 03/18/19 2105  Pulse 85 03/18/19 2108  Resp 13 03/18/19 2109  SpO2 98 % 03/18/19 2108  Vitals shown include unvalidated device data.  Last Pain:  Vitals:   03/18/19 1619  TempSrc:   PainSc: 0-No pain      Patients Stated Pain Goal: 0 (31/43/88 8757)  Complications: No apparent anesthesia complications

## 2019-03-18 NOTE — Progress Notes (Signed)
Updated wife via telphone.

## 2019-03-18 NOTE — Anesthesia Preprocedure Evaluation (Addendum)
Anesthesia Evaluation  Patient identified by MRN, date of birth, ID band Patient awake    Reviewed: Allergy & Precautions, NPO status , Patient's Chart, lab work & pertinent test results  History of Anesthesia Complications Negative for: history of anesthetic complications  Airway Mallampati: II  TM Distance: >3 FB Neck ROM: Full    Dental  (+) Poor Dentition, Missing, Dental Advisory Given   Pulmonary shortness of breath, sleep apnea and Continuous Positive Airway Pressure Ventilation , PE 03/17/2019 SARS coronavirus NEG   breath sounds clear to auscultation       Cardiovascular hypertension, Pt. on medications (-) angina Rhythm:Regular Rate:Normal  03/18/2019 ECHO: EF 60-65%, large pericardial effusion   Neuro/Psych negative neurological ROS     GI/Hepatic negative GI ROS, Neg liver ROS,   Endo/Other  diabetes (glu 98), Oral Hypoglycemic Agentsobese  Renal/GU      Musculoskeletal   Abdominal (+) + obese,   Peds  Hematology Xarelto/coumadin: INR 2.6   Anesthesia Other Findings   Reproductive/Obstetrics                            Anesthesia Physical Anesthesia Plan  ASA: IV  Anesthesia Plan: General   Post-op Pain Management:    Induction: Intravenous  PONV Risk Score and Plan: 2 and Ondansetron, Dexamethasone and Treatment may vary due to age or medical condition  Airway Management Planned: Oral ETT and Double Lumen EBT  Additional Equipment: Arterial line, CVP and Ultrasound Guidance Line Placement  Intra-op Plan:   Post-operative Plan: Extubation in OR  Informed Consent: I have reviewed the patients History and Physical, chart, labs and discussed the procedure including the risks, benefits and alternatives for the proposed anesthesia with the patient or authorized representative who has indicated his/her understanding and acceptance.     Dental advisory given  Plan  Discussed with: CRNA and Surgeon  Anesthesia Plan Comments:        Anesthesia Quick Evaluation

## 2019-03-18 NOTE — Progress Notes (Signed)
  Echocardiogram 2D Echocardiogram has been performed.  Zae Kirtz G Denea Cheaney 03/18/2019, 9:08 AM

## 2019-03-18 NOTE — Op Note (Signed)
    [address] [place] [address] [phone]   03/18/2019  Patient:  Steven Tapia Pre-Op Dx:     pericardial effusion                         Tamponade physiology   Post-op Dx:  same Procedure: - Right Video assisted thoracoscopy - Pericardial window   Surgeon and Role:      * Kamarian Sahakian, Lucile Crater, MD - Primary    Josie Saunders, PAC - assisting  EBL:  Minimal  Blood Administration: none Specimen:  Pericardial fluid, pericardium  Drains: 72 F argyle chest tube in right chest Counts: correct   Indications: 63 yo male admitted with shortness of breath.  He was noted to have a pericardial effusion.  He underwent an attempted pericardial drain placement.  He denies any shortness of breath currently.  Findings: The fluid was thin and bloody.   Operative Technique: After the risks, benefits and alternatives were thoroughly discussed, the patient was brought to the operative theatre.  Anesthesia was induced. The patient was then placed in a lazy lateral decubitus position and was prepped and draped in normal sterile fashion.  An appropriate surgical pause was performed, and pre-operative antibiotics were dosed accordingly.  A 1 cm incision was made in the midaxillary line and the chest was entered with a 5 mm trocar.  The chest was then insufflated.  We then placed another 5 mm trocar and 10 mm trocar to triangulate the pericardium.  Small hole was made in the pericardium to release the effluent and her blood pressure rose significantly.  We then performed a 3 x 3 cm pericardial window.  A 19 [place] Blake drain was then passed through the 10 mm trocar into the pericardial space.  The effluent was aspirated and collected in Luken's traps.  At this point the the insufflation was released and the lung expanded nicely.  The other incisions were closed with absorbable suture.  The patient tolerated the procedure without any  immediate complications, and was transferred to the PACU in stable condition.  [name]

## 2019-03-18 NOTE — Brief Op Note (Signed)
03/18/2019  7:23 PM  PATIENT:  Steven Tapia  64 y.o. male  PRE-OPERATIVE DIAGNOSIS:  Pericardial effusion with tamponade  POST-OPERATIVE DIAGNOSIS: Pericardial effusion with tamponade  PROCEDURE:  RIGHT VATS, PERICARDIAL WINDOW FINDINGS: Approximately 450 cc of hemorrhagic pericardial fluid removed  SURGEON:  Surgeon(s) and Role:    Melodie Bouillon, MD - Primary  PHYSICIAN ASSISTANT: Lars Pinks PA-C  ANESTHESIA:   general  EBL:  Per anesthesia record   BLOOD ADMINISTERED:none  DRAINS: 19 Blake drain placed in the pericardial space   SPECIMEN:  Source of Specimen:  Pericardial biopsy and pericardial fluid  DISPOSITION OF SPECIMEN:  Cytology, gram stain, culture  COUNTS:  YES  DICTATION: .Dragon Dictation  PLAN OF CARE: Admit to inpatient   PATIENT DISPOSITION:  PACU - hemodynamically stable.   Delay start of Pharmacological VTE agent (>24hrs) due to surgical blood loss or risk of bleeding: yes

## 2019-03-18 NOTE — Anesthesia Procedure Notes (Addendum)
Arterial Line Insertion Start/End9/29/2020 6:20 PM, 03/18/2019 6:50 PM Performed by: Annye Asa, MD, Milford Cage, CRNA, anesthesiologist  Patient location: Pre-op. Preanesthetic checklist: patient identified, IV checked, site marked, risks and benefits discussed, surgical consent, monitors and equipment checked, pre-op evaluation, timeout performed and anesthesia consent Lidocaine 1% used for infiltration radial was placed Catheter size: 20 G Hand hygiene performed , maximum sterile barriers used  and Seldinger technique used  Attempts: 4 (CRNA, then MD) Procedure performed using ultrasound guided technique. Ultrasound Notes:anatomy identified, needle tip was noted to be adjacent to the nerve/plexus identified, no ultrasound evidence of intravascular and/or intraneural injection and image(s) printed for medical record Following insertion, dressing applied. Post procedure assessment: normal and unchanged  Patient tolerated the procedure well with no immediate complications.

## 2019-03-18 NOTE — Anesthesia Procedure Notes (Signed)
Procedure Name: Intubation Performed by: Milford Cage, CRNA Pre-anesthesia Checklist: Patient identified, Emergency Drugs available, Suction available and Patient being monitored Patient Re-evaluated:Patient Re-evaluated prior to induction Oxygen Delivery Method: Circle System Utilized Preoxygenation: Pre-oxygenation with 100% oxygen Induction Type: IV induction Ventilation: Oral airway inserted - appropriate to patient size and Mask ventilation with difficulty Laryngoscope Size: Mac and 4 Grade View: Grade II Tube type: Oral Endobronchial tube: Left, Double lumen EBT, EBT position confirmed by fiberoptic bronchoscope and EBT position confirmed by auscultation and 41 Fr Number of attempts: 1 Airway Equipment and Method: Stylet and Oral airway Placement Confirmation: ETT inserted through vocal cords under direct vision,  positive ETCO2 and breath sounds checked- equal and bilateral Secured at: 28 cm Tube secured with: Tape Dental Injury: Teeth and Oropharynx as per pre-operative assessment

## 2019-03-18 NOTE — Anesthesia Postprocedure Evaluation (Signed)
Anesthesia Post Note  Patient: Artemio Dobie  Procedure(s) Performed: RIGHT VIDEO ASSISTED THORACOSCOPY (VATS) PERICARDIAL WINDOW (Right Chest)     Patient location during evaluation: PACU Anesthesia Type: General Level of consciousness: awake and alert, patient cooperative and oriented Pain management: pain level controlled Vital Signs Assessment: post-procedure vital signs reviewed and stable Respiratory status: spontaneous breathing, nonlabored ventilation, respiratory function stable and patient connected to nasal cannula oxygen Cardiovascular status: blood pressure returned to baseline and stable Postop Assessment: no apparent nausea or vomiting Anesthetic complications: no    Last Vitals:  Vitals:   03/18/19 2130 03/18/19 2145  BP: 118/70   Pulse: 80 82  Resp: (!) 30 (!) 26  Temp:    SpO2: 100% 96%    Last Pain:  Vitals:   03/18/19 2105  TempSrc:   PainSc: 4                  Brittannie Tawney,E. Trypp Heckmann

## 2019-03-18 NOTE — Progress Notes (Signed)
Late entry: Patient was placed on monitor on arrival from Cath lab. Patient HR 90's SR, SpO2 100% on 2 lpm. Patient remained 100% and SR thoughtout time in Short Stay including during placement of central line.

## 2019-03-18 NOTE — Interval H&P Note (Signed)
History and Physical Interval Note:  03/18/2019 2:39 PM  Steven Tapia  has presented today for surgery, with the diagnosis of pericardiocentesis.  The various methods of treatment have been discussed with the patient and family. After consideration of risks, benefits and other options for treatment, the patient has consented to  Procedure(s): PERICARDIOCENTESIS (N/A) as a surgical intervention.  The patient's history has been reviewed, patient examined, no change in status, stable for surgery.  I have reviewed the patient's chart and labs.  Questions were answered to the patient's satisfaction.    Large effusion by echo.  Larae Grooms

## 2019-03-18 NOTE — Progress Notes (Addendum)
  Amiodarone Drug - Drug Interaction Consult Note  Recommendations: Ondansetron (Zofran) can prolong QTc; consider alternative anti-emetic while on amiodarone Monitor vital signs while on labetalol  Amiodarone is metabolized by the cytochrome P450 system and therefore has the potential to cause many drug interactions. Amiodarone has an average plasma half-life of 50 days (range 20 to 100 days).   There is potential for drug interactions to occur several weeks or months after stopping treatment and the onset of drug interactions may be slow after initiating amiodarone.   [x]  Statins: Increased risk of myopathy. Simvastatin- restrict  dose to 20mg  daily. Other statins: counsel patients to report any muscle pain or weakness immediately.  []  Anticoagulants: Amiodarone can increase anticoagulant effect. Consider warfarin dose reduction. Patients should be monitored closely and the dose of anticoagulant altered accordingly, remembering that amiodarone levels take several weeks to stabilize.  []  Antiepileptics: Amiodarone can increase plasma concentration of phenytoin, the dose should be reduced. Note that small changes in phenytoin dose can result in large changes in levels. Monitor patient and counsel on signs of toxicity.  [x]  Beta blockers: increased risk of bradycardia, AV block and myocardial depression. Sotalol - avoid concomitant use.  []   Calcium channel blockers (diltiazem and verapamil): increased risk of bradycardia, AV block and myocardial depression.  []   Cyclosporine: Amiodarone increases levels of cyclosporine. Reduced dose of cyclosporine is recommended.  []  Digoxin dose should be halved when amiodarone is started.  []  Diuretics: increased risk of cardiotoxicity if hypokalemia occurs.  []  Oral hypoglycemic agents (glyburide, glipizide, glimepiride): increased risk of hypoglycemia. Patient's glucose levels should be monitored closely when initiating amiodarone therapy.    [x]  Drugs that prolong the QT interval:  Torsades de pointes risk may be increased with concurrent use - avoid if possible.  Monitor QTc, also keep magnesium/potassium WNL if concurrent therapy can't be avoided. Marland Kitchen Antibiotics: e.g. fluoroquinolones, erythromycin. . Antiarrhythmics: e.g. quinidine, procainamide, disopyramide,  sotalol. . Antipsychotics: e.g. phenothiazines, haloperidol.  . Lithium, tricyclic antidepressants, and methadone.   Ondansetron   Thank You,  Gillermina Hu, PharmD, BCPS, [facility] 03/18/2019 10:06 PM

## 2019-03-18 NOTE — Anesthesia Procedure Notes (Signed)
Central Venous Catheter Insertion Performed by: Annye Asa, MD, anesthesiologist Start/End9/29/2020 6:24 PM, 03/18/2019 6:40 PM Patient location: Pre-op. Preanesthetic checklist: patient identified, IV checked, site marked, risks and benefits discussed, surgical consent, monitors and equipment checked, pre-op evaluation, timeout performed and anesthesia consent Position: supine Lidocaine 1% used for infiltration and patient sedated Hand hygiene performed  and maximum sterile barriers used  Catheter size: 8 Fr Total catheter length 16. Central line was placed.Double lumen Procedure performed using ultrasound guided technique. Ultrasound Notes:anatomy identified, needle tip was noted to be adjacent to the nerve/plexus identified, no ultrasound evidence of intravascular and/or intraneural injection and image(s) printed for medical record Attempts: 1 Following insertion, line sutured, dressing applied and Biopatch. Post procedure assessment: blood return through all ports, free fluid flow and no air  Patient tolerated the procedure well with no immediate complications. Additional procedure comments: CVP: Timeout, sterile prep, drape, FBP R neck.  Supine position.  1% lido local, finder and trocar RIJ 1st pass with US guidance.  2 lumen placed over J wire. Biopatch and sterile dressing on.  Patient tolerated well.  VSS.  Jenita Seashore, MD.

## 2019-03-18 NOTE — Consult Note (Signed)
[address]       [place] [address]             [phone]        Abhijay Morriss [facility] Medical Record #[id number] Date of Birth: Jan 20, 1956  Referring: No ref. provider found Primary Care: Ronney Lion, NP Primary Cardiologist:No primary care provider on file.  Chief Complaint:    Chief Complaint  Patient presents with   Hypertension    History of Present Illness:     63 yo male admitted with shortness of breath.  He was noted to have a pericardial effusion.  He underwent an attempted pericardial drain placement.  He denies any shortness of breath currently.   Past Medical and Surgical History: Previous Chest Surgery: no Previous Chest Radiation: no Diabetes Mellitus: yes.   Creatinine: 0.94  Past Medical History:  Diagnosis Date   Diabetes mellitus without complication (HCC)    Hypertension    PE (pulmonary thromboembolism) (HCC)     Past Surgical History:  Procedure Laterality Date   ACHILLES TENDON REPAIR     ACHILLES TENDON SURGERY        Social History   Tobacco Use  Smoking Status Never Smoker  Smokeless Tobacco Never Used    Social History   Substance and Sexual Activity  Alcohol Use Never   Frequency: Never     No Known Allergies    Current Facility-Administered Medications  Medication Dose Route Frequency Provider Last Rate Last Dose   0.9 %  sodium chloride infusion   Intravenous Continuous Corky Crafts, MD       0.9 % irrigation (POUR BTL)    PRN Corliss Skains, MD   1,000 mL at 03/18/19 1742   [MAR Hold] acetaminophen (TYLENOL) suppository 650 mg  650 mg Rectal Q6H PRN Rolly Salter, MD       acetaminophen (TYLENOL) tablet 650 mg  650 mg Oral Q4H PRN Corky Crafts, MD       [facility] Hold] atorvastatin (LIPITOR) tablet 20 mg  20 mg Oral Daily Rolly Salter, MD   20 mg at 03/18/19 1024   bupivacaine liposome (EXPAREL) 1.3 % injection 266 mg  20 mL Infiltration To OR  Marcy Bogosian, Eliezer Lofts, MD       [MAR Hold] docusate sodium (COLACE) capsule 100 mg  100 mg Oral BID Rolly Salter, MD   100 mg at 03/18/19 1024   hydrALAZINE (APRESOLINE) injection 10 mg  10 mg Intravenous Q20 Min PRN Corky Crafts, MD       [facility] Hold] insulin aspart (novoLOG) injection 0-5 Units  0-5 Units Subcutaneous QHS Rolly Salter, MD       [facility] Hold] insulin aspart (novoLOG) injection 0-9 Units  0-9 Units Subcutaneous TID WC Rolly Salter, MD       labetalol (NORMODYNE) injection 10 mg  10 mg Intravenous Q10 min PRN Corky Crafts, MD       [facility] Hold] ondansetron [facility]) tablet 4 mg  4 mg Oral Q6H PRN Rolly Salter, MD       Or   [facility] Hold] ondansetron [facility]) injection 4 mg  4 mg Intravenous Q6H PRN Rolly Salter, MD       ondansetron [facility]) injection 4 mg  4 mg Intravenous Q6H PRN Corky Crafts, MD        Medications Prior to Admission  Medication Sig Dispense Refill Last Dose   amLODipine (  NORVASC) 10 MG tablet Take 1 tablet (10 mg total) by mouth daily. For BP 30 tablet 3 03/17/2019 at Unknown time   atorvastatin (LIPITOR) 20 MG tablet Take 20 mg by mouth daily.   03/17/2019 at Unknown time   hydrochlorothiazide (HYDRODIURIL) 25 MG tablet Take 25 mg by mouth daily.   03/17/2019 at Unknown time   lisinopril (PRINIVIL,ZESTRIL) 40 MG tablet Take 40 mg by mouth daily.   03/17/2019 at Unknown time   metFORMIN (GLUCOPHAGE) 500 MG tablet Take 1 tablet (500 mg total) by mouth 2 (two) times daily with a meal. Start on 06/28/2018 60 tablet 5 03/17/2019 at Unknown time   warfarin (COUMADIN) 10 MG tablet Take 10 mg by mouth daily.   03/17/2019 at 0900   Rivaroxaban 15 & 20 MG TBPK Take as directed on package: Start with one 15mg  tablet by mouth twice a day with food. On Day 22, switch to one 20mg  tablet once a day with food. (Patient not taking: Reported on 03/17/2019) 32 each 0 Not Taking at Unknown time    Family History  Problem Relation Age of Onset    Alzheimer's disease Mother    Clotting disorder Father    Diabetes Sister      Review of Systems:   Review of Systems  Constitutional: Negative.   HENT: Negative.   Respiratory: Negative for cough and shortness of breath.   Cardiovascular: Negative for chest pain.  Gastrointestinal: Negative.   Neurological: Negative.       Physical Exam: BP 122/78    Pulse 99    Temp 97.9 F (36.6 C) (Temporal)    Resp (!) 39    Ht 6\' 4"  (1.93 m)    Wt 130.9 kg    SpO2 100%    BMI 35.13 kg/m  Physical Exam  Constitutional: He is oriented to person, place, and time. No distress.  HENT:  Head: Normocephalic and atraumatic.  Eyes: Conjunctivae are normal. No scleral icterus.  Neck: Normal range of motion. No tracheal deviation present.  Cardiovascular:  tachycardic  Pulmonary/Chest: Effort normal. No respiratory distress.  Abdominal: Soft. He exhibits no distension.  Neurological: He is alert and oriented to person, place, and time.  Skin: Skin is warm and dry. He is not diaphoretic.      Diagnostic Studies & Laboratory data:       I have independently reviewed the above radiologic studies and discussed with the patient   Recent Lab Findings: Lab Results  Component Value Date   WBC 4.4 03/18/2019   HGB 10.3 (L) 03/18/2019   HCT 31.7 (L) 03/18/2019   PLT 317 03/18/2019   GLUCOSE 118 (H) 03/18/2019   ALT 43 03/18/2019   AST 21 03/18/2019   NA 134 (L) 03/18/2019   K 3.8 03/18/2019   CL 100 03/18/2019   CREATININE 0.94 03/18/2019   BUN 16 03/18/2019   CO2 27 03/18/2019   TSH 0.466 03/17/2019   INR 2.6 (H) 03/18/2019   HGBA1C 6.6 (H) 03/17/2019      Assessment / Plan:   63 yo male with pericardial effusion OR for R VATS, pericardial window.     I  spent 30 minutes counseling the patient face to face.   Lajuana Matte 03/18/2019 6:04 PM

## 2019-03-18 NOTE — H&P (View-Only) (Signed)
Cardiology Consultation:  Patient ID: Steven Tapia MRN: [id number]; DOB: June 01, 1956  Admit date: 03/17/2019 Date of Consult: 03/18/2019  Primary Care Provider: Levin Bacon, NP Primary Cardiologist: Addison Naegeli. Audie Box, MD (new)  Patient Profile:  Steven Tapia is a 63 y.o. 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  History of Present Illness:  Steven Tapia presents with tachycardia noted 4 days ago.  He reports he was getting a life insurance physical and noted that he was tachycardic.  He is then monitored his heart rate on his apple watch and noticed it to be tachycardic as well.  He reports episodes of heart rate in the 100-110 range.  He reports he is been able to go about his daily life without any major limitations.  He reports he may have noticed some shortness of breath.  He reports his blood pressures been a little lower than normal.  He states his blood pressures been in the 120 range, but is normally in the 140-150 range.  He reports no recent viral illness, no rashes, no recent medication changes.  The only major change to his medical regimen has been Coumadin over the past few months.  He states he was discharged home with a DVT/pulmonary embolism with Eliquis however his insurance would not pay for it.  He was evaluated emergency room, and underwent a CT chest angiography and was noted to have a large pericardial effusion, with evidence of small bilateral pleural effusions.  He had no obvious dissection on my read, however this was a non-gated study.  His vital signs have been stable overnight, with heart rate in the 100-120 range.  He has had no episodes of hypotension that I can tell.  Lab work remarkable for normal creatinine 1.03.  Hemoglobin has been noted to be down to 11.2 from around 14 in March of this year.  Cardiac  enzymes were negative x2 (high-sensitivity troponin value of 3).  A BNP was obtained and this was 14.  The only real abnormality noted was an INR of 2.6.  EKG demonstrates sinus tachycardia with right bundle branch block, low voltage in the limb leads.  There is no obvious electrical alternans.   Heart Pathway Score:       Past Medical History: Past Medical History:  Diagnosis Date   Diabetes mellitus without complication ([place])    Hypertension    PE (pulmonary thromboembolism) ([place])     Past Surgical History: Past Surgical History:  Procedure Laterality Date   ACHILLES TENDON REPAIR     ACHILLES TENDON SURGERY       Home Medications:  Prior to Admission medications   Medication Sig Start Date End Date Taking? Authorizing Provider  amLODipine (NORVASC) 10 MG tablet Take 1 tablet (10 mg total) by mouth daily. For BP 06/26/18  Yes Emokpae, Courage, MD  atorvastatin (LIPITOR) 20 MG tablet Take 20 mg by mouth daily.   Yes [provider]  hydrochlorothiazide (HYDRODIURIL) 25 MG tablet Take 25 mg by mouth daily. 03/01/18  Yes [provider]  lisinopril (PRINIVIL,ZESTRIL) 40 MG tablet Take 40 mg by mouth daily.   Yes [provider]  metFORMIN (GLUCOPHAGE) 500 MG tablet Take 1 tablet (500 mg total) by mouth 2 (two) times daily with a meal. Start on 06/28/2018 06/26/18  Yes Emokpae, Courage, MD  warfarin (COUMADIN) 10 MG  tablet Take 10 mg by mouth daily. 02/07/19  Yes [provider]  Rivaroxaban 15 & 20 MG TBPK Take as directed on package: Start with one 15mg  tablet by mouth twice a day with food. On Day 22, switch to one 20mg  tablet once a day with food. Patient not taking: Reported on 03/17/2019 08/19/18   Arthur HolmsZhao, Yan, MD    Inpatient Medications: Scheduled Meds:  sodium chloride   Intravenous Once   amLODipine  10 mg Oral Daily   atorvastatin  20 mg Oral Daily   docusate sodium  100 mg Oral BID   insulin aspart  0-5 Units Subcutaneous QHS    insulin aspart  0-9 Units Subcutaneous TID WC   lisinopril  40 mg Oral Daily   Continuous Infusions:  PRN Meds: acetaminophen **OR** acetaminophen, ondansetron **OR** ondansetron (ZOFRAN) IV  Allergies:    No Known Allergies  Social History:   Social History   Socioeconomic History   Marital status: Married    Spouse name: Not on file   Number of children: Not on file   Years of education: Not on file   Highest education level: Not on file  Occupational History   Not on file  Social Needs   Financial resource strain: Not on file   Food insecurity    Worry: Not on file    Inability: Not on file   Transportation needs    Medical: Not on file    Non-medical: Not on file  Tobacco Use   Smoking status: Never Smoker   Smokeless tobacco: Never Used  Substance and Sexual Activity   Alcohol use: Never    Frequency: Never   Drug use: Never   Sexual activity: Not on file  Lifestyle   Physical activity    Days per week: Not on file    Minutes per session: Not on file   Stress: Not on file  Relationships   Social connections    Talks on phone: Not on file    Gets together: Not on file    Attends religious service: Not on file    Active member of club or organization: Not on file    Attends meetings of clubs or organizations: Not on file    Relationship status: Not on file   Intimate partner violence    Fear of current or ex partner: Not on file    Emotionally abused: Not on file    Physically abused: Not on file    Forced sexual activity: Not on file  Other Topics Concern   Not on file  Social History Narrative   Not on file     Family History:    Family History  Problem Relation Age of Onset   Alzheimer's disease Mother    Clotting disorder Father    Diabetes Sister      ROS:  All other ROS reviewed and negative. Pertinent positives noted in the HPI.     Physical Exam/Data:   Vitals:   03/17/19 1856 03/17/19 2110 03/18/19 0455  03/18/19 0905  BP: (!) 148/82 135/85 99/73 128/73  Pulse: (!) 104 (!) 110 (!) 102 (!) 104  Resp: 20   20  Temp: 99.1 F (37.3 C) 98.6 F (37 C) 99 F (37.2 C) 98.6 F (37 C)  TempSrc: Oral Oral Oral Oral  SpO2: 98% 96% 97% 98%  Weight: 134.3 kg  130.9 kg   Height: 6\' 4"  (1.93 m)        Intake/Output Summary (Last  24 hours) at 03/18/2019 1009 Last data filed at 03/17/2019 2100 Gross per 24 hour  Intake 240 ml  Output --  Net 240 ml    Last 3 Weights 03/18/2019 03/17/2019 08/19/2018  Weight (lbs) 288 lb 9.6 oz 296 lb 296 lb 12 oz  Weight (kg) 130.908 kg 134.265 kg 134.605 kg    Body mass index is 35.13 kg/m.  General: Well nourished, well developed, in no acute distress Head: Atraumatic, normal size  Eyes: PEERLA, EOMI  Neck: Supple, jugular venous distention noted around 10 to 12 cm of water Endocrine: No thryomegaly Cardiac: Tachycardia noted, distant heart sounds Lungs: Crackles in the lung bases Abd: Soft, nontender, no hepatomegaly  Ext: No edema, pulses 2+ Musculoskeletal: No deformities, BUE and BLE strength normal and equal Skin: Warm and dry, no rashes   Neuro: Alert and oriented to person, place, time, and situation, CNII-XII grossly intact, no focal deficits  Psych: Normal mood and affect   EKG:  The EKG was personally reviewed and demonstrates: Sinus tachycardia, right bundle branch block, low voltage in limb leads Telemetry:  Telemetry was personally reviewed and demonstrates: Sinus tachycardia, no arrhythmias  Relevant CV Studies: I personally reviewed his echocardiogram, this demonstrates normal LV ejection fraction of 60%.  He does have evidence of diastolic collapse of the right ventricle.  The IVC is plethoric and dilated.  There is respiratory variation in the mitral inflow pattern.  Laboratory Data: High Sensitivity Troponin:   Recent Labs  Lab 03/17/19 1039 03/17/19 1239  TROPONINIHS 3 3     Cardiac EnzymesNo results for input(s): TROPONINI in the  last 168 hours. No results for input(s): TROPIPOC in the last 168 hours.  Chemistry Recent Labs  Lab 03/17/19 1039  NA 135  K 3.6  CL 100  CO2 24  GLUCOSE 139*  BUN 18  CREATININE 1.03  CALCIUM 8.7*  GFRNONAA >60  GFRAA >60  ANIONGAP 11    Recent Labs  Lab 03/17/19 1054  PROT 7.4  ALBUMIN 3.6  AST 23  ALT 52*  ALKPHOS 87  BILITOT 1.1   Hematology Recent Labs  Lab 03/17/19 1039  WBC 4.8  RBC 4.19*  HGB 11.2*  HCT 34.7*  MCV 82.8  MCH 26.7  MCHC 32.3  RDW 16.3*  PLT 316   BNP Recent Labs  Lab 03/18/19 0716  BNP 13.8    DDimer No results for input(s): DDIMER in the last 168 hours.  Radiology/Studies:  Dg Chest 2 View  Result Date: 03/17/2019 CLINICAL DATA:  Tachycardia, hypertension EXAM: CHEST - 2 VIEW COMPARISON:  06/24/2018 FINDINGS: Mild cardiomegaly, stable. Mild pulmonary vascular congestion. Patchy bibasilar opacities, left worse than right. No pleural effusion. No pneumothorax. IMPRESSION: 1. Cardiomegaly with mild pulmonary vascular congestion. 2. Bibasilar opacities, left greater than right, which may reflect atelectasis and/or pneumonia in the appropriate clinical setting. Electronically Signed   By: Duanne Guess M.D.   On: 03/17/2019 12:29   Ct Angio Chest Pe W And/or Wo Contrast  Result Date: 03/17/2019 CLINICAL DATA:  Elevated blood pressure. History of diabetes and pulmonary embolism. EXAM: CT ANGIOGRAPHY CHEST WITH CONTRAST TECHNIQUE: Multidetector CT imaging of the chest was performed using the standard protocol during bolus administration of intravenous contrast. Multiplanar CT image reconstructions and MIPs were obtained to evaluate the vascular anatomy. CONTRAST:  OMNIPAQUE IOHEXOL 350 MG/ML SOLN COMPARISON:  Prior CT 06/24/2018. FINDINGS: Cardiovascular: The pulmonary arteries are well opacified with contrast to the level of the subsegmental branches. There is  no evidence of acute pulmonary embolism. The aorta and great vessels  demonstrate no significant findings. There is a new moderate to large pericardial effusion. The heart size is normal. There is no significant reflux of contrasted blood into the IVC or hepatic veins. Mediastinum/Nodes: There are no enlarged mediastinal, hilar or axillary lymph nodes.Stable nodularity of the left thyroid lobe. The trachea and esophagus appear normal. Lungs/Pleura: New small left-greater-than-right pleural effusions with associated dependent atelectasis at both lung bases. No confluent airspace opacity, endobronchial lesion or suspicious pulmonary nodule. Upper abdomen:  The visualized upper abdomen appears unremarkable. Musculoskeletal/Chest wall: There is no chest wall mass or suspicious osseous finding. Review of the MIP images confirms the above findings. IMPRESSION: 1. No evidence of acute pulmonary embolism. 2. New moderate to large pericardial effusion without CT evidence of tamponade. This could be symptomatic. Consider further evaluation with echocardiography. 3. New small bilateral pleural effusions with associated bibasilar atelectasis. 4. Stable nodularity of the left thyroid lobe, previously evaluated by ultrasound. 5. These results were called by telephone at the time of interpretation on 03/17/2019 at 2:14 pm to provider ADAM CURATOLO , who verbally acknowledged these results. Electronically Signed   By: Carey Bullocks M.D.   On: 03/17/2019 14:15   TTE 9/29  1. Left ventricular ejection fraction, by visual estimation, is 60 to 65%. The left ventricle has normal function. Normal left ventricular size. There is mildly increased left ventricular hypertrophy.  2. Left ventricular diastolic Doppler parameters are consistent with impaired relaxation pattern of LV diastolic filling.  3. Global right ventricle has normal systolic function.The right ventricular size is normal.  4. Right atrial size was normal.  5. Large pericardial effusion.  6. The mitral valve is normal in structure.  No evidence of mitral valve regurgitation. No evidence of mitral stenosis.  7. The tricuspid valve is not well visualized. Tricuspid valve regurgitation was not visualized by color flow Doppler.  8. The aortic valve is normal in structure. Aortic valve regurgitation was not visualized by color flow Doppler. Structurally normal aortic valve, with no evidence of sclerosis or stenosis.  9. The pulmonic valve was normal in structure. Pulmonic valve regurgitation is not visualized by color flow Doppler. 10. The inferior vena cava is dilated in size with <50% respiratory variability, suggesting right atrial pressure of 15 mmHg. 11. Normal LV systolic function; grade 1 diastolic dysfunction; large pericardial effusion; respiratory variation, dilated IVC and RV diastolic collapse noted suggesting tamponade physiology; mild LVH; results relayed to Ronie Spies and cardiology consult  pending.  Assessment and Plan:  1. Pericardial effusion, echocardiogram suggestive of tamponade -Really unclear etiology of the effusion.  It is large, circumferential, and he does have evidence of tamponade on his echocardiogram (diastolic RV collapse, plethoric IVC, respiratory variation in mitral inflow pattern).  He clinically is stable, and has no overt clinical evidence of tamponade.  However, this effusion is large and he definitely will need this drained today.  The only real etiology that I have on laboratory data is an INR of 2.6.  I have asked the hospital medicine team to transfuse 2 units of FFP.  He gives [place] no history of infection, pericarditis, inflammatory reason to have this.  Thyroid studies are normal.  His CT scan demonstrates no evidence of obvious dissection.  He has been relatively stable, and only noticed that he was tachycardic at home.  I do suspect this is hemorrhagic, but we will send all the cytology studies after this is drained. -I  have held his home blood pressure medications -Stat 2 units FFP described  above 2. Hypertension -I have held his home blood pressure medications 3.  Diabetes -A1c 6.6, slight scale insulin  The cardiology service will take over care of this patient as he has rather obvious cardiology issue without major medical problems.      For questions or updates, please contact [facility] Please consult [email or web address] for contact info under     Signed, Gerri Spore T. Flora Lipps, MD [place]   [facility]  03/18/2019 10:09 AM

## 2019-03-18 NOTE — Consult Note (Signed)
Cardiology Consultation:  Patient ID: Steven Tapia MRN: [id number]; DOB: June 01, 1956  Admit date: 03/17/2019 Date of Consult: 03/18/2019  Primary Care Provider: Levin Bacon, NP Primary Cardiologist: Addison Naegeli. Audie Box, MD (new)  Patient Profile:  Steven Tapia is a 63 y.o. 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  History of Present Illness:  Mr. Steven Tapia presents with tachycardia noted 4 days ago.  He reports he was getting a life insurance physical and noted that he was tachycardic.  He is then monitored his heart rate on his apple watch and noticed it to be tachycardic as well.  He reports episodes of heart rate in the 100-110 range.  He reports he is been able to go about his daily life without any major limitations.  He reports he may have noticed some shortness of breath.  He reports his blood pressures been a little lower than normal.  He states his blood pressures been in the 120 range, but is normally in the 140-150 range.  He reports no recent viral illness, no rashes, no recent medication changes.  The only major change to his medical regimen has been Coumadin over the past few months.  He states he was discharged home with a DVT/pulmonary embolism with Eliquis however his insurance would not pay for it.  He was evaluated emergency room, and underwent a CT chest angiography and was noted to have a large pericardial effusion, with evidence of small bilateral pleural effusions.  He had no obvious dissection on my read, however this was a non-gated study.  His vital signs have been stable overnight, with heart rate in the 100-120 range.  He has had no episodes of hypotension that I can tell.  Lab work remarkable for normal creatinine 1.03.  Hemoglobin has been noted to be down to 11.2 from around 14 in March of this year.  Cardiac  enzymes were negative x2 (high-sensitivity troponin value of 3).  A BNP was obtained and this was 14.  The only real abnormality noted was an INR of 2.6.  EKG demonstrates sinus tachycardia with right bundle branch block, low voltage in the limb leads.  There is no obvious electrical alternans.   Heart Pathway Score:       Past Medical History: Past Medical History:  Diagnosis Date   Diabetes mellitus without complication ([place])    Hypertension    PE (pulmonary thromboembolism) ([place])     Past Surgical History: Past Surgical History:  Procedure Laterality Date   ACHILLES TENDON REPAIR     ACHILLES TENDON SURGERY       Home Medications:  Prior to Admission medications   Medication Sig Start Date End Date Taking? Authorizing Provider  amLODipine (NORVASC) 10 MG tablet Take 1 tablet (10 mg total) by mouth daily. For BP 06/26/18  Yes Emokpae, Courage, MD  atorvastatin (LIPITOR) 20 MG tablet Take 20 mg by mouth daily.   Yes [provider]  hydrochlorothiazide (HYDRODIURIL) 25 MG tablet Take 25 mg by mouth daily. 03/01/18  Yes [provider]  lisinopril (PRINIVIL,ZESTRIL) 40 MG tablet Take 40 mg by mouth daily.   Yes [provider]  metFORMIN (GLUCOPHAGE) 500 MG tablet Take 1 tablet (500 mg total) by mouth 2 (two) times daily with a meal. Start on 06/28/2018 06/26/18  Yes Emokpae, Courage, MD  warfarin (COUMADIN) 10 MG  tablet Take 10 mg by mouth daily. 02/07/19  Yes [provider]  Rivaroxaban 15 & 20 MG TBPK Take as directed on package: Start with one 15mg  tablet by mouth twice a day with food. On Day 22, switch to one 20mg  tablet once a day with food. Patient not taking: Reported on 03/17/2019 08/19/18   Arthur HolmsZhao, Yan, MD    Inpatient Medications: Scheduled Meds:  sodium chloride   Intravenous Once   amLODipine  10 mg Oral Daily   atorvastatin  20 mg Oral Daily   docusate sodium  100 mg Oral BID   insulin aspart  0-5 Units Subcutaneous QHS    insulin aspart  0-9 Units Subcutaneous TID WC   lisinopril  40 mg Oral Daily   Continuous Infusions:  PRN Meds: acetaminophen **OR** acetaminophen, ondansetron **OR** ondansetron (ZOFRAN) IV  Allergies:    No Known Allergies  Social History:   Social History   Socioeconomic History   Marital status: Married    Spouse name: Not on file   Number of children: Not on file   Years of education: Not on file   Highest education level: Not on file  Occupational History   Not on file  Social Needs   Financial resource strain: Not on file   Food insecurity    Worry: Not on file    Inability: Not on file   Transportation needs    Medical: Not on file    Non-medical: Not on file  Tobacco Use   Smoking status: Never Smoker   Smokeless tobacco: Never Used  Substance and Sexual Activity   Alcohol use: Never    Frequency: Never   Drug use: Never   Sexual activity: Not on file  Lifestyle   Physical activity    Days per week: Not on file    Minutes per session: Not on file   Stress: Not on file  Relationships   Social connections    Talks on phone: Not on file    Gets together: Not on file    Attends religious service: Not on file    Active member of club or organization: Not on file    Attends meetings of clubs or organizations: Not on file    Relationship status: Not on file   Intimate partner violence    Fear of current or ex partner: Not on file    Emotionally abused: Not on file    Physically abused: Not on file    Forced sexual activity: Not on file  Other Topics Concern   Not on file  Social History Narrative   Not on file     Family History:    Family History  Problem Relation Age of Onset   Alzheimer's disease Mother    Clotting disorder Father    Diabetes Sister      ROS:  All other ROS reviewed and negative. Pertinent positives noted in the HPI.     Physical Exam/Data:   Vitals:   03/17/19 1856 03/17/19 2110 03/18/19 0455  03/18/19 0905  BP: (!) 148/82 135/85 99/73 128/73  Pulse: (!) 104 (!) 110 (!) 102 (!) 104  Resp: 20   20  Temp: 99.1 F (37.3 C) 98.6 F (37 C) 99 F (37.2 C) 98.6 F (37 C)  TempSrc: Oral Oral Oral Oral  SpO2: 98% 96% 97% 98%  Weight: 134.3 kg  130.9 kg   Height: 6\' 4"  (1.93 m)        Intake/Output Summary (Last  24 hours) at 03/18/2019 1009 Last data filed at 03/17/2019 2100 Gross per 24 hour  Intake 240 ml  Output --  Net 240 ml    Last 3 Weights 03/18/2019 03/17/2019 08/19/2018  Weight (lbs) 288 lb 9.6 oz 296 lb 296 lb 12 oz  Weight (kg) 130.908 kg 134.265 kg 134.605 kg    Body mass index is 35.13 kg/m.  General: Well nourished, well developed, in no acute distress Head: Atraumatic, normal size  Eyes: PEERLA, EOMI  Neck: Supple, jugular venous distention noted around 10 to 12 cm of water Endocrine: No thryomegaly Cardiac: Tachycardia noted, distant heart sounds Lungs: Crackles in the lung bases Abd: Soft, nontender, no hepatomegaly  Ext: No edema, pulses 2+ Musculoskeletal: No deformities, BUE and BLE strength normal and equal Skin: Warm and dry, no rashes   Neuro: Alert and oriented to person, place, time, and situation, CNII-XII grossly intact, no focal deficits  Psych: Normal mood and affect   EKG:  The EKG was personally reviewed and demonstrates: Sinus tachycardia, right bundle branch block, low voltage in limb leads Telemetry:  Telemetry was personally reviewed and demonstrates: Sinus tachycardia, no arrhythmias  Relevant CV Studies: I personally reviewed his echocardiogram, this demonstrates normal LV ejection fraction of 60%.  He does have evidence of diastolic collapse of the right ventricle.  The IVC is plethoric and dilated.  There is respiratory variation in the mitral inflow pattern.  Laboratory Data: High Sensitivity Troponin:   Recent Labs  Lab 03/17/19 1039 03/17/19 1239  TROPONINIHS 3 3     Cardiac EnzymesNo results for input(s): TROPONINI in the  last 168 hours. No results for input(s): TROPIPOC in the last 168 hours.  Chemistry Recent Labs  Lab 03/17/19 1039  NA 135  K 3.6  CL 100  CO2 24  GLUCOSE 139*  BUN 18  CREATININE 1.03  CALCIUM 8.7*  GFRNONAA >60  GFRAA >60  ANIONGAP 11    Recent Labs  Lab 03/17/19 1054  PROT 7.4  ALBUMIN 3.6  AST 23  ALT 52*  ALKPHOS 87  BILITOT 1.1   Hematology Recent Labs  Lab 03/17/19 1039  WBC 4.8  RBC 4.19*  HGB 11.2*  HCT 34.7*  MCV 82.8  MCH 26.7  MCHC 32.3  RDW 16.3*  PLT 316   BNP Recent Labs  Lab 03/18/19 0716  BNP 13.8    DDimer No results for input(s): DDIMER in the last 168 hours.  Radiology/Studies:  Dg Chest 2 View  Result Date: 03/17/2019 CLINICAL DATA:  Tachycardia, hypertension EXAM: CHEST - 2 VIEW COMPARISON:  06/24/2018 FINDINGS: Mild cardiomegaly, stable. Mild pulmonary vascular congestion. Patchy bibasilar opacities, left worse than right. No pleural effusion. No pneumothorax. IMPRESSION: 1. Cardiomegaly with mild pulmonary vascular congestion. 2. Bibasilar opacities, left greater than right, which may reflect atelectasis and/or pneumonia in the appropriate clinical setting. Electronically Signed   By: Duanne Guess M.D.   On: 03/17/2019 12:29   Ct Angio Chest Pe W And/or Wo Contrast  Result Date: 03/17/2019 CLINICAL DATA:  Elevated blood pressure. History of diabetes and pulmonary embolism. EXAM: CT ANGIOGRAPHY CHEST WITH CONTRAST TECHNIQUE: Multidetector CT imaging of the chest was performed using the standard protocol during bolus administration of intravenous contrast. Multiplanar CT image reconstructions and MIPs were obtained to evaluate the vascular anatomy. CONTRAST:  OMNIPAQUE IOHEXOL 350 MG/ML SOLN COMPARISON:  Prior CT 06/24/2018. FINDINGS: Cardiovascular: The pulmonary arteries are well opacified with contrast to the level of the subsegmental branches. There is  no evidence of acute pulmonary embolism. The aorta and great vessels  demonstrate no significant findings. There is a new moderate to large pericardial effusion. The heart size is normal. There is no significant reflux of contrasted blood into the IVC or hepatic veins. Mediastinum/Nodes: There are no enlarged mediastinal, hilar or axillary lymph nodes.Stable nodularity of the left thyroid lobe. The trachea and esophagus appear normal. Lungs/Pleura: New small left-greater-than-right pleural effusions with associated dependent atelectasis at both lung bases. No confluent airspace opacity, endobronchial lesion or suspicious pulmonary nodule. Upper abdomen:  The visualized upper abdomen appears unremarkable. Musculoskeletal/Chest wall: There is no chest wall mass or suspicious osseous finding. Review of the MIP images confirms the above findings. IMPRESSION: 1. No evidence of acute pulmonary embolism. 2. New moderate to large pericardial effusion without CT evidence of tamponade. This could be symptomatic. Consider further evaluation with echocardiography. 3. New small bilateral pleural effusions with associated bibasilar atelectasis. 4. Stable nodularity of the left thyroid lobe, previously evaluated by ultrasound. 5. These results were called by telephone at the time of interpretation on 03/17/2019 at 2:14 pm to provider ADAM CURATOLO , who verbally acknowledged these results. Electronically Signed   By: William  Veazey M.D.   On: 03/17/2019 14:15  ° °TTE 9/29 ° 1. Left ventricular ejection fraction, by visual estimation, is 60 to 65%. The left ventricle has normal function. Normal left ventricular size. There is mildly increased left ventricular hypertrophy. ° 2. Left ventricular diastolic Doppler parameters are consistent with impaired relaxation pattern of LV diastolic filling. ° 3. Global right ventricle has normal systolic function.The right ventricular size is normal. ° 4. Right atrial size was normal. ° 5. Large pericardial effusion. ° 6. The mitral valve is normal in structure.  No evidence of mitral valve regurgitation. No evidence of mitral stenosis. ° 7. The tricuspid valve is not well visualized. Tricuspid valve regurgitation was not visualized by color flow Doppler. ° 8. The aortic valve is normal in structure. Aortic valve regurgitation was not visualized by color flow Doppler. Structurally normal aortic valve, with no evidence of sclerosis or stenosis. ° 9. The pulmonic valve was normal in structure. Pulmonic valve regurgitation is not visualized by color flow Doppler. °10. The inferior vena cava is dilated in size with <50% respiratory variability, suggesting right atrial pressure of 15 mmHg. °11. Normal LV systolic function; grade 1 diastolic dysfunction; large pericardial effusion; respiratory variation, dilated IVC and RV diastolic collapse noted suggesting tamponade physiology; mild LVH; results relayed to Dayna Dunn and cardiology consult ° pending. ° °Assessment and Plan:  °1. Pericardial effusion, echocardiogram suggestive of tamponade °-Really unclear etiology of the effusion.  It is large, circumferential, and he does have evidence of tamponade on his echocardiogram (diastolic RV collapse, plethoric IVC, respiratory variation in mitral inflow pattern).  He clinically is stable, and has no overt clinical evidence of tamponade.  However, this effusion is large and he definitely will need this drained today.  The only real etiology that I have on laboratory data is an INR of 2.6.  I have asked the hospital medicine team to transfuse 2 units of FFP.  He gives us no history of infection, pericarditis, inflammatory reason to have this.  Thyroid studies are normal.  His CT scan demonstrates no evidence of obvious dissection.  He has been relatively stable, and only noticed that he was tachycardic at home.  I do suspect this is hemorrhagic, but we will send all the cytology studies after this is drained. °-I   have held his home blood pressure medications -Stat 2 units FFP described  above 2. Hypertension -I have held his home blood pressure medications 3.  Diabetes -A1c 6.6, slight scale insulin  The cardiology service will take over care of this patient as he has rather obvious cardiology issue without major medical problems.      For questions or updates, please contact [facility] Please consult [email or web address] for contact info under     Signed, Gerri Spore T. Flora Lipps, MD [place]   [facility]  03/18/2019 10:09 AM

## 2019-03-18 NOTE — Progress Notes (Signed)
[facility] PROGRESS NOTE  Steven Tapia GGE:[id number] DOB: April 28, 1956 DOA: 03/17/2019 PCP: Levin Bacon, NP  Assessment/Plan:  1. Pericardial effusion/Sinus tachycardia.  pericardial effusion seen on CT scan. Troponins are negative.  EKG shows no evidence of pericarditis.  No evidence of tamponade clinically or on CT scan. Echocardiogram with EF 60% mild LVH, Large pericardial effusion, grade 1 diastolic dysfunction dilated IVC and RV diastolic collapse suggesting tamponade physiology.  TSH within limits of normal, CRP 3.1, LDH and BNP within limits of normal evaluated by cardiology who recommend pericardiocentisis and FFPl. Concern for hemorrhagic etiology.  Cards will transfer patient to cardiology service   2.Anemia.  There is a drop in patient's hemoglobin from March 2020 to now. Hg drifting down. Hg 11.2 on admission down from 13.8 3/20. Home meds include coumadin for hx PE. Coumadin held on admission. INR 2.6 on admission. -FFP -monitor  3.  Essential hypertension. Monitor for hypotension. Home meds being held per cards  3.  Concern for acute on chronic diastolic CHF. While the CT scan shows evidence of groundglass opacities concerning for peripheral edema given the presence of large pericardial effusion and normal blood pressure. -defer to cards  4.  Type 2 diabetes mellitus. Patient is on metformin at home. Hemoglobin A1c 6.5 suggesting controlled diabetes. Currently will hold off on metformin and use sliding scale insulin.  5.  History of DVT PE.  January 2020 Patient is on warfarin at home. See above  Code Status: full Family Communication: pateint Disposition Plan: to be determined   Consultants:  Oneal cardilogy  Procedures:    Antibiotics:    HPI/Subjective: Awake alert oriented denies pain/discomfort  Objective: Vitals:   03/18/19 0455 03/18/19 0905  BP: 99/73 128/73  Pulse: (!) 102 (!) 104  Resp:  20  Temp: 99 F (37.2 C) 98.6 F (37  C)  SpO2: 97% 98%    Intake/Output Summary (Last 24 hours) at 03/18/2019 1013 Last data filed at 03/17/2019 2100 Gross per 24 hour  Intake 240 ml  Output -  Net 240 ml   Filed Weights   03/17/19 1856 03/18/19 0455  Weight: 134.3 kg 130.9 kg    Exam:   General:  Well nourished alert no acute distress  Cardiovascular: tachycardia no mgr no LE edema  Respiratory: normal effort Fine crackles bilateral bases  Abdomen: obese soft +BS no guarding or rebounding  Musculoskeletal: joints without swelling/erythema   Data Reviewed: Basic Metabolic Panel: Recent Labs  Lab 03/17/19 1039  NA 135  K 3.6  CL 100  CO2 24  GLUCOSE 139*  BUN 18  CREATININE 1.03  CALCIUM 8.7*   Liver Function Tests: Recent Labs  Lab 03/17/19 1054  AST 23  ALT 52*  ALKPHOS 87  BILITOT 1.1  PROT 7.4  ALBUMIN 3.6   Recent Labs  Lab 03/17/19 1054  LIPASE 23   No results for input(s): AMMONIA in the last 168 hours. CBC: Recent Labs  Lab 03/17/19 1039  WBC 4.8  HGB 11.2*  HCT 34.7*  MCV 82.8  PLT 316   Cardiac Enzymes: No results for input(s): CKTOTAL, CKMB, CKMBINDEX, TROPONINI in the last 168 hours. BNP (last 3 results) Recent Labs    03/18/19 0716  BNP 13.8    ProBNP (last 3 results) No results for input(s): PROBNP in the last 8760 hours.  CBG: Recent Labs  Lab 03/17/19 2108 03/18/19 0730  GLUCAP 102* 115*    Recent Results (from the past 240 hour(s))  SARS Coronavirus 2 (  Hospital order, Performed in [facility] hospital lab) Nasopharyngeal Nasopharyngeal Swab     Status: None   Collection Time: 03/17/19  2:31 PM   Specimen: Nasopharyngeal Swab  Result Value Ref Range Status   SARS Coronavirus 2 NEGATIVE NEGATIVE Final    Comment: (NOTE) If result is NEGATIVE SARS-CoV-2 target nucleic acids are NOT DETECTED. The SARS-CoV-2 RNA is generally detectable in upper and lower  respiratory specimens during the acute phase of infection. The lowest  concentration of  SARS-CoV-2 viral copies this assay can detect is 250  copies / mL. A negative result does not preclude SARS-CoV-2 infection  and should not be used as the sole basis for treatment or other  patient management decisions.  A negative result may occur with  improper specimen collection / handling, submission of specimen other  than nasopharyngeal swab, presence of viral mutation(s) within the  areas targeted by this assay, and inadequate number of viral copies  (<250 copies / mL). A negative result must be combined with clinical  observations, patient history, and epidemiological information. If result is POSITIVE SARS-CoV-2 target nucleic acids are DETECTED. The SARS-CoV-2 RNA is generally detectable in upper and lower  respiratory specimens dur ing the acute phase of infection.  Positive  results are indicative of active infection with SARS-CoV-2.  Clinical  correlation with patient history and other diagnostic information is  necessary to determine patient infection status.  Positive results do  not rule out bacterial infection or co-infection with other viruses. If result is PRESUMPTIVE POSTIVE SARS-CoV-2 nucleic acids MAY BE PRESENT.   A presumptive positive result was obtained on the submitted specimen  and confirmed on repeat testing.  While 2019 novel coronavirus  (SARS-CoV-2) nucleic acids may be present in the submitted sample  additional confirmatory testing may be necessary for epidemiological  and / or clinical management purposes  to differentiate between  SARS-CoV-2 and other Sarbecovirus currently known to infect humans.  If clinically indicated additional testing with an alternate test  methodology [phone]) is advised. The SARS-CoV-2 RNA is generally  detectable in upper and lower respiratory sp ecimens during the acute  phase of infection. The expected result is Negative. Fact Sheet for Patients:  BoilerBrush.com.cy Fact Sheet for Healthcare  Providers: https://pope.com/ This test is not yet approved or cleared by the [place] FDA and has been authorized for detection and/or diagnosis of SARS-CoV-2 by FDA under an Emergency Use Authorization (EUA).  This EUA will remain in effect (meaning this test can be used) for the duration of the COVID-19 declaration under Section 564(b)(1) of the Act, 21 U.S.C. section 360bbb-3(b)(1), unless the authorization is terminated or revoked sooner. Performed at [facility], [address], [place], [place] [address]      Studies: Dg Chest 2 View  Result Date: 03/17/2019 CLINICAL DATA:  Tachycardia, hypertension EXAM: CHEST - 2 VIEW COMPARISON:  06/24/2018 FINDINGS: Mild cardiomegaly, stable. Mild pulmonary vascular congestion. Patchy bibasilar opacities, left worse than right. No pleural effusion. No pneumothorax. IMPRESSION: 1. Cardiomegaly with mild pulmonary vascular congestion. 2. Bibasilar opacities, left greater than right, which may reflect atelectasis and/or pneumonia in the appropriate clinical setting. Electronically Signed   By: Duanne Guess M.D.   On: 03/17/2019 12:29   Ct Angio Chest Pe W And/or Wo Contrast  Result Date: 03/17/2019 CLINICAL DATA:  Elevated blood pressure. History of diabetes and pulmonary embolism. EXAM: CT ANGIOGRAPHY CHEST WITH CONTRAST TECHNIQUE: Multidetector CT imaging of the chest was performed using the standard  protocol during bolus administration of intravenous contrast. Multiplanar CT image reconstructions and MIPs were obtained to evaluate the vascular anatomy. CONTRAST:  100mL OMNIPAQUE IOHEXOL 350 MG/ML SOLN COMPARISON:  Prior CT 06/24/2018. FINDINGS: Cardiovascular: The pulmonary arteries are well opacified with contrast to the level of the subsegmental branches. There is no evidence of acute pulmonary embolism. The aorta and great vessels demonstrate no significant findings. There is a new moderate to  large pericardial effusion. The heart size is normal. There is no significant reflux of contrasted blood into the IVC or hepatic veins. Mediastinum/Nodes: There are no enlarged mediastinal, hilar or axillary lymph nodes.Stable nodularity of the left thyroid lobe. The trachea and esophagus appear normal. Lungs/Pleura: New small left-greater-than-right pleural effusions with associated dependent atelectasis at both lung bases. No confluent airspace opacity, endobronchial lesion or suspicious pulmonary nodule. Upper abdomen:  The visualized upper abdomen appears unremarkable. Musculoskeletal/Chest wall: There is no chest wall mass or suspicious osseous finding. Review of the MIP images confirms the above findings. IMPRESSION: 1. No evidence of acute pulmonary embolism. 2. New moderate to large pericardial effusion without CT evidence of tamponade. This could be symptomatic. Consider further evaluation with echocardiography. 3. New small bilateral pleural effusions with associated bibasilar atelectasis. 4. Stable nodularity of the left thyroid lobe, previously evaluated by ultrasound. 5. These results were called by telephone at the time of interpretation on 03/17/2019 at 2:14 pm to provider ADAM CURATOLO , who verbally acknowledged these results. Electronically Signed   By: Carey BullocksWilliam  Veazey M.D.   On: 03/17/2019 14:15    Scheduled Meds: . sodium chloride   Intravenous Once  . amLODipine  10 mg Oral Daily  . atorvastatin  20 mg Oral Daily  . docusate sodium  100 mg Oral BID  . insulin aspart  0-5 Units Subcutaneous QHS  . insulin aspart  0-9 Units Subcutaneous TID WC  . lisinopril  40 mg Oral Daily   Continuous Infusions:  Principal Problem:   Pericardial effusion Active Problems:   HTN (hypertension)   PE (pulmonary thromboembolism) ([facility])   DM2 (diabetes mellitus, type 2) ([facility])   On continuous oral anticoagulation   Hyperlipidemia    Time spent: 40 minutes    [facility] Medical GroupBLACK,Deseree Zemaitis M NP [facility]  If 7PM-7AM, please contact night-coverage at [email or web address], password [email or web address] 03/18/2019, 10:13 AM  LOS: 0 days

## 2019-03-19 ENCOUNTER — Inpatient Hospital Stay (HOSPITAL_COMMUNITY): Payer: BC Managed Care – PPO

## 2019-03-19 ENCOUNTER — Encounter (HOSPITAL_COMMUNITY): Payer: Self-pay | Admitting: Interventional Cardiology

## 2019-03-19 DIAGNOSIS — I313 Pericardial effusion (noninflammatory): Secondary | ICD-10-CM

## 2019-03-19 LAB — BPAM FFP
Blood Product Expiration Date: 202010022359
Blood Product Expiration Date: 202010022359
ISSUE DATE / TIME: 202009291208
ISSUE DATE / TIME: 202009291346
Unit Type and Rh: 6200
Unit Type and Rh: 6200

## 2019-03-19 LAB — COMPREHENSIVE METABOLIC PANEL
ALT: 38 U/L (ref 0–44)
AST: 24 U/L (ref 15–41)
Albumin: 2.7 g/dL — ABNORMAL LOW (ref 3.5–5.0)
Alkaline Phosphatase: 66 U/L (ref 38–126)
Anion gap: 10 (ref 5–15)
BUN: 16 mg/dL (ref 8–23)
CO2: 26 mmol/L (ref 22–32)
Calcium: 7.6 mg/dL — ABNORMAL LOW (ref 8.9–10.3)
Chloride: 101 mmol/L (ref 98–111)
Creatinine, Ser: 1.01 mg/dL (ref 0.61–1.24)
GFR calc Af Amer: >60 mL/min (ref >60)
GFR calc non Af Amer: >60 mL/min (ref >60)
Glucose, Bld: 158 mg/dL — ABNORMAL HIGH (ref 70–99)
Potassium: 3.4 mmol/L — ABNORMAL LOW (ref 3.5–5.1)
Sodium: 137 mmol/L (ref 135–145)
Total Bilirubin: 1.2 mg/dL (ref 0.3–1.2)
Total Protein: 6.3 g/dL — ABNORMAL LOW (ref 6.5–8.1)

## 2019-03-19 LAB — BASIC METABOLIC PANEL
Anion gap: 8 (ref 5–15)
BUN: 17 mg/dL (ref 8–23)
CO2: 23 mmol/L (ref 22–32)
Calcium: 7.4 mg/dL — ABNORMAL LOW (ref 8.9–10.3)
Chloride: 102 mmol/L (ref 98–111)
Creatinine, Ser: 0.97 mg/dL (ref 0.61–1.24)
GFR calc Af Amer: >60 mL/min (ref >60)
GFR calc non Af Amer: >60 mL/min (ref >60)
Glucose, Bld: 149 mg/dL — ABNORMAL HIGH (ref 70–99)
Potassium: 4 mmol/L (ref 3.5–5.1)
Sodium: 133 mmol/L — ABNORMAL LOW (ref 135–145)

## 2019-03-19 LAB — PREPARE FRESH FROZEN PLASMA
Unit division: 0
Unit division: 0

## 2019-03-19 LAB — CBC
HCT: 29.5 % — ABNORMAL LOW (ref 39.0–52.0)
Hemoglobin: 9.8 g/dL — ABNORMAL LOW (ref 13.0–17.0)
MCH: 27.1 pg (ref 26.0–34.0)
MCHC: 33.2 g/dL (ref 30.0–36.0)
MCV: 81.5 fL (ref 80.0–100.0)
Platelets: 267 10*3/uL (ref 150–400)
RBC: 3.62 MIL/uL — ABNORMAL LOW (ref 4.22–5.81)
RDW: 15.9 % — ABNORMAL HIGH (ref 11.5–15.5)
WBC: 5.6 10*3/uL (ref 4.0–10.5)
nRBC: 0 % (ref 0.0–0.2)

## 2019-03-19 LAB — ANTINUCLEAR ANTIBODIES, IFA: ANA Ab, IFA: NEGATIVE

## 2019-03-19 LAB — ECHOCARDIOGRAM LIMITED
Height: 76 in
Weight: 4701.97 oz

## 2019-03-19 LAB — MRSA PCR SCREENING: MRSA by PCR: NEGATIVE

## 2019-03-19 LAB — PROTIME-INR
INR: 1.5 — ABNORMAL HIGH (ref 0.8–1.2)
Prothrombin Time: 18.2 seconds — ABNORMAL HIGH (ref 11.4–15.2)

## 2019-03-19 LAB — GLUCOSE, CAPILLARY: Glucose-Capillary: 137 mg/dL — ABNORMAL HIGH (ref 70–99)

## 2019-03-19 MED ORDER — AMIODARONE HCL 200 MG PO TABS: 200.0000 mg | ORAL_TABLET | Freq: Two times a day (BID) | ORAL | Status: DC

## 2019-03-19 MED ORDER — FUROSEMIDE 10 MG/ML IJ SOLN
20.0000 mg | Freq: Once | INTRAMUSCULAR | Status: AC
  Administered 2019-03-19: 20 mg via INTRAVENOUS
  Filled 2019-03-19: qty 2

## 2019-03-19 MED FILL — Lidocaine HCl Local Preservative Free (PF) Inj 1%: INTRAMUSCULAR | Qty: 30 | Status: AC

## 2019-03-19 NOTE — Evaluation (Signed)
Occupational Therapy Evaluation and Discharge Patient Details Name: Steven Tapia MRN: [id number] DOB: 1956-06-14 Today's Date: 03/19/2019    History of Present Illness Pt is a 63 year old man with hx of DM, HTN and PE admitted on 03/18/19 with tachycardia and large pericardial effusion with findings consistent with pericardial tamponade. Underwent pericardial window on 9/29.,   Clinical Impression   Pt is functioning at a supervision level for lines and safety. Recommending ADL and ambulation to bathroom with nursing staff. No further OT needs.    Follow Up Recommendations  No OT follow up    Equipment Recommendations  None recommended by OT    Recommendations for Other Services       Precautions / Restrictions Precautions Precaution Comments: chest tube Restrictions Weight Bearing Restrictions: No      Mobility Bed Mobility Overal bed mobility: Independent             General bed mobility comments: no difficulty  Transfers Overall transfer level: Needs assistance Equipment used: None Transfers: Sit to/from Stand;Stand Pivot Transfers Sit to Stand: Supervision Stand pivot transfers: Supervision       General transfer comment: assist for lines/safety    Balance Overall balance assessment: Mild deficits observed, not formally tested                                         ADL either performed or assessed with clinical judgement   ADL                                               Vision Baseline Vision/History: Wears glasses Wears Glasses: Reading only Patient Visual Report: No change from baseline       Perception     Praxis      Pertinent Vitals/Pain Pain Assessment: Faces Faces Pain Scale: Hurts little more Pain Location: chest tube site Pain Descriptors / Indicators: Sore Pain Intervention(s): Monitored during session;Repositioned     Hand Dominance Right   Extremity/Trunk Assessment Upper Extremity  Assessment Upper Extremity Assessment: Overall WFL for tasks assessed   Lower Extremity Assessment Lower Extremity Assessment: Defer to PT evaluation   Cervical / Trunk Assessment Cervical / Trunk Assessment: Normal   Communication Communication Communication: No difficulties   Cognition Arousal/Alertness: Awake/alert Behavior During Therapy: WFL for tasks assessed/performed Overall Cognitive Status: Within Functional Limits for tasks assessed                                     General Comments       Exercises     Shoulder Instructions      Home Living Family/patient expects to be discharged to:: Private residence Living Arrangements: Spouse/significant other Available Help at Discharge: Family;Available 24 hours/day Type of Home: Apartment Home Access: Level entry     Home Layout: One level     Bathroom Shower/Tub: Chief Strategy Officer: Standard     Home Equipment: None          Prior Functioning/Environment Level of Independence: Independent        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 Problem List:  OT Treatment/Interventions:      OT Goals(Current goals can be found in the care plan section) Acute Rehab OT Goals Patient Stated Goal: to return to independent  OT Frequency:     Barriers to D/C:            Co-evaluation              AM-PAC OT "6 Clicks" Daily Activity     Outcome Measure Help from another person eating meals?: None Help from another person taking care of personal grooming?: None Help from another person toileting, which includes using toliet, bedpan, or urinal?: None Help from another person bathing (including washing, rinsing, drying)?: None Help from another person to put on and taking off regular upper body clothing?: None Help from another person to put on and taking off regular lower body clothing?: None 6 Click Score: 24   End of Session Equipment Utilized During  Treatment: Oxygen  Activity Tolerance: Patient tolerated treatment well Patient left: in chair;with call bell/phone within reach  OT Visit Diagnosis: Pain                Time: 2683-4196 OT Time Calculation (min): 20 min Charges:  OT General Charges $OT Visit: 1 Visit OT Evaluation $OT Eval Moderate Complexity: 1 Mod  Nestor Lewandowsky, OTR/L Acute Rehabilitation Services Pager: [phone] Office: [phone] Malka So 03/19/2019, 1:33 PM

## 2019-03-19 NOTE — Progress Notes (Signed)
      [address]       [place] [address]             [phone]                 1 Day Post-Op Procedure(s) (LRB): RIGHT VIDEO ASSISTED THORACOSCOPY (VATS) PERICARDIAL WINDOW (Right)   Events: No events.  Symptoms improved _______________________________________________________________ Vitals: BP 130/64   Pulse 68   Temp 98 F (36.7 C) (Axillary)   Resp (!) 24   Ht 6\' 4"  (1.93 m)   Wt 133.3 kg   SpO2 100%   BMI 35.77 kg/m   - Neuro: alert NAD  - Cardiovascular: sinus, regular    - Pulm: EWOB    ABG No results found for: PHART, PCO2ART, PO2ART, HCO3, TCO2, ACIDBASEDEF, O2SAT  - Abd: soft, NT, ND - Extremity: warm  .Intake/Output      09/29 0701 - 09/30 0700 09/30 0701 - 10/01 0700   P.O.  240   I.V. (mL/kg) 2743.2 (20.6) 349.9 (2.6)   Blood 710    IV Piggyback 100.1    Total Intake(mL/kg) 3553.3 (26.7) 589.9 (4.4)   Urine (mL/kg/hr) 650 (0.2)    Blood 100    Chest Tube 330    Total Output 1080    Net +2473.3 +589.9           _______________________________________________________________ Labs: CBC Latest Ref Rng & Units 03/19/2019 03/18/2019 03/18/2019  WBC 4.0 - 10.5 K/uL 5.6 5.5 4.4  Hemoglobin 13.0 - 17.0 g/dL 9.8(L) 9.3(L) 10.3(L)  Hematocrit 39.0 - 52.0 % 29.5(L) 29.5(L) 31.7(L)  Platelets 150 - 400 K/uL 267 276 317   CMP Latest Ref Rng & Units 03/19/2019 03/18/2019 03/17/2019  Glucose 70 - 99 mg/dL 149(H) 118(H) 139(H)  BUN 8 - 23 mg/dL 17 16 18   Creatinine 0.61 - 1.24 mg/dL 0.97 0.94 1.03  Sodium 135 - 145 mmol/L 133(L) 134(L) 135  Potassium 3.5 - 5.1 mmol/L 4.0 3.8 3.6  Chloride 98 - 111 mmol/L 102 100 100  CO2 22 - 32 mmol/L 23 27 24   Calcium 8.9 - 10.3 mg/dL 7.4(L) 8.6(L) 8.7(L)  Total Protein 6.5 - 8.1 g/dL - 6.8 7.4  Total Bilirubin 0.3 - 1.2 mg/dL - 0.9 1.1  Alkaline Phos 38 - 126 U/L - 82 87  AST 15 - 41 U/L - 21 23  ALT 0 - 44 U/L - 43 52(H)    CXR: pending   _______________________________________________________________  Assessment and Plan: POD 1 s/p R VATS, pericardial window  Neuro: pain controlled CV: stable.  Will keep drain for now.  Will transition to bulb tomorrow Pulm: pulm toilet  Dispo: per primary team  Melodie Bouillon, MD 03/19/2019 12:07 PM

## 2019-03-19 NOTE — Progress Notes (Signed)
Pt set up on CPAP tolerating well.  

## 2019-03-19 NOTE — Progress Notes (Signed)
Cardiology Progress Note  Patient ID: Steven Tapia MRN: [id number] DOB: Jun 07, 1956 Date of Encounter: 03/19/2019  Primary Cardiologist: No primary care provider on file.  Subjective  Status post pericardial window yesterday with CT surgery.  He is doing better today.  Chest tubes remain in place.  Plans to be removed.  He appears to be in atrial fibrillation, paroxysmally.  Reports no shortness of breath or chest pain this AM.  ROS:  All other ROS reviewed and negative. Pertinent positives noted in the HPI.     Inpatient Medications  Scheduled Meds:  atorvastatin  20 mg Oral Daily   bisacodyl  10 mg Oral Daily   Chlorhexidine Gluconate Cloth  6 each Topical Daily   docusate sodium  100 mg Oral BID   furosemide  20 mg Intramuscular Once   ketorolac  15 mg Intravenous Q6H   senna-docusate  1 tablet Oral QHS   sodium chloride flush  3 mL Intravenous Q12H   Continuous Infusions:  sodium chloride     sodium chloride     0.9 % NaCl with KCl 20 mEq / L 100 mL/hr at 03/19/19 0600   amiodarone 30 mg/hr (03/19/19 0600)    ceFAZolin (ANCEF) IV Stopped (03/19/19 0524)   PRN Meds: sodium chloride, Place/Maintain arterial line **AND** sodium chloride, [DISCONTINUED] acetaminophen **OR** acetaminophen, acetaminophen, morphine injection, ondansetron **OR** ondansetron (ZOFRAN) IV, ondansetron (ZOFRAN) IV, sodium chloride flush, traMADol   Vital Signs   Vitals:   03/19/19 0334 03/19/19 0400 03/19/19 0500 03/19/19 0600  BP:  99/61 114/78 118/61  Pulse:  62 71 (!) 52  Resp:  19 (!) 22 (!) 22  Temp: 98 F (36.7 C)     TempSrc: Axillary     SpO2:  98% 100% 98%  Weight:   133.3 kg   Height:        Intake/Output Summary (Last 24 hours) at 03/19/2019 0840 Last data filed at 03/19/2019 0642 Gross per 24 hour  Intake 3553.28 ml  Output 1080 ml  Net 2473.28 ml   Last 3 Weights 03/19/2019 03/18/2019 03/18/2019  Weight (lbs) 293 lb 14 oz 294 lb 5 oz 288 lb 9.6 oz  Weight (kg)  133.3 kg 133.5 kg 130.908 kg      Telemetry  Overnight telemetry shows atrial fibrillation with heart rate in the 70s, which I personally reviewed.   ECG  The most recent ECG is pending.  Physical Exam   Vitals:   03/19/19 0334 03/19/19 0400 03/19/19 0500 03/19/19 0600  BP:  99/61 114/78 118/61  Pulse:  62 71 (!) 52  Resp:  19 (!) 22 (!) 22  Temp: 98 F (36.7 C)     TempSrc: Axillary     SpO2:  98% 100% 98%  Weight:   133.3 kg   Height:         Intake/Output Summary (Last 24 hours) at 03/19/2019 0840 Last data filed at 03/19/2019 0642 Gross per 24 hour  Intake 3553.28 ml  Output 1080 ml  Net 2473.28 ml    Last 3 Weights 03/19/2019 03/18/2019 03/18/2019  Weight (lbs) 293 lb 14 oz 294 lb 5 oz 288 lb 9.6 oz  Weight (kg) 133.3 kg 133.5 kg 130.908 kg    Body mass index is 35.77 kg/m.  General: Well nourished, well developed, in no acute distress Head: Atraumatic, normal size  Eyes: PEERLA, EOMI  Neck: Supple, JVD noted to be 10 to 12 cm of water Endocrine: No thryomegaly Cardiac: Irregular rhythm, pericardial rub noted  Lungs: Diminished breath sounds at the bases bilaterally Abd: Soft, nontender, no hepatomegaly  Ext: No edema, pulses 2+ Musculoskeletal: No deformities, BUE and BLE strength normal and equal Skin: Warm and dry, no rashes   Neuro: Alert and oriented to person, place, time, and situation, CNII-XII grossly intact, no focal deficits  Psych: Normal mood and affect   Labs  High Sensitivity Troponin:   Recent Labs  Lab 03/17/19 1039 03/17/19 1239  TROPONINIHS 3 3     Cardiac EnzymesNo results for input(s): TROPONINI in the last 168 hours. No results for input(s): TROPIPOC in the last 168 hours.  Chemistry Recent Labs  Lab 03/17/19 1039 03/17/19 1054 03/18/19 1116 03/19/19 0616  NA 135  --  134* 133*  K 3.6  --  3.8 4.0  CL 100  --  100 102  CO2 24  --  27 23  GLUCOSE 139*  --  118* 149*  BUN 18  --  16 17  CREATININE 1.03  --  0.94 0.97  CALCIUM  8.7*  --  8.6* 7.4*  PROT  --  7.4 6.8  --   ALBUMIN  --  3.6 3.0*  --   AST  --  23 21  --   ALT  --  52* 43  --   ALKPHOS  --  87 82  --   BILITOT  --  1.1 0.9  --   GFRNONAA >60  --  >60 >60  GFRAA >60  --  >60 >60  ANIONGAP 11  --  7 8    Hematology Recent Labs  Lab 03/18/19 1116 03/18/19 2143 03/19/19 0616  WBC 4.4 5.5 5.6  RBC 3.87* 3.54* 3.62*  HGB 10.3* 9.3* 9.8*  HCT 31.7* 29.5* 29.5*  MCV 81.9 83.3 81.5  MCH 26.6 26.3 27.1  MCHC 32.5 31.5 33.2  RDW 16.5* 16.4* 15.9*  PLT 317 276 267   BNP Recent Labs  Lab 03/18/19 0716  BNP 13.8    DDimer No results for input(s): DDIMER in the last 168 hours.   Radiology  Dg Chest 2 View  Result Date: 03/17/2019 CLINICAL DATA:  Tachycardia, hypertension EXAM: CHEST - 2 VIEW COMPARISON:  06/24/2018 FINDINGS: Mild cardiomegaly, stable. Mild pulmonary vascular congestion. Patchy bibasilar opacities, left worse than right. No pleural effusion. No pneumothorax. IMPRESSION: 1. Cardiomegaly with mild pulmonary vascular congestion. 2. Bibasilar opacities, left greater than right, which may reflect atelectasis and/or pneumonia in the appropriate clinical setting. Electronically Signed   By: Davina Poke M.D.   On: 03/17/2019 12:29   Ct Angio Chest Pe W And/or Wo Contrast  Result Date: 03/17/2019 CLINICAL DATA:  Elevated blood pressure. History of diabetes and pulmonary embolism. EXAM: CT ANGIOGRAPHY CHEST WITH CONTRAST TECHNIQUE: Multidetector CT imaging of the chest was performed using the standard protocol during bolus administration of intravenous contrast. Multiplanar CT image reconstructions and MIPs were obtained to evaluate the vascular anatomy. CONTRAST:  168m OMNIPAQUE IOHEXOL 350 MG/ML SOLN COMPARISON:  Prior CT 06/24/2018. FINDINGS: Cardiovascular: The pulmonary arteries are well opacified with contrast to the level of the subsegmental branches. There is no evidence of acute pulmonary embolism. The aorta and great vessels  demonstrate no significant findings. There is a new moderate to large pericardial effusion. The heart size is normal. There is no significant reflux of contrasted blood into the IVC or hepatic veins. Mediastinum/Nodes: There are no enlarged mediastinal, hilar or axillary lymph nodes.Stable nodularity of the left thyroid lobe. The trachea and esophagus appear  normal. Lungs/Pleura: New small left-greater-than-right pleural effusions with associated dependent atelectasis at both lung bases. No confluent airspace opacity, endobronchial lesion or suspicious pulmonary nodule. Upper abdomen:  The visualized upper abdomen appears unremarkable. Musculoskeletal/Chest wall: There is no chest wall mass or suspicious osseous finding. Review of the MIP images confirms the above findings. IMPRESSION: 1. No evidence of acute pulmonary embolism. 2. New moderate to large pericardial effusion without CT evidence of tamponade. This could be symptomatic. Consider further evaluation with echocardiography. 3. New small bilateral pleural effusions with associated bibasilar atelectasis. 4. Stable nodularity of the left thyroid lobe, previously evaluated by ultrasound. 5. These results were called by telephone at the time of interpretation on 03/17/2019 at 2:14 pm to provider ADAM CURATOLO , who verbally acknowledged these results. Electronically Signed   By: Richardean Sale M.D.   On: 03/17/2019 14:15   Dg Chest Port 1 View  Result Date: 03/19/2019 CLINICAL DATA:  Chest tube, pneumothorax EXAM: PORTABLE CHEST 1 VIEW COMPARISON:  Portable exam 0540 hours compared to 03/18/2019 FINDINGS: RIGHT chest tube unchanged. Additional drainage tube projects over the cardiac silhouette question pericardial. RIGHT jugular central venous catheter with tip projecting over SVC near cavoatrial junction. Enlargement of cardiac silhouette with pulmonary vascular congestion. Increased opacity at the LEFT lung base likely representing a combination of  pleural effusion with increased atelectasis versus consolidation in the LEFT lower lobe and lingula. Minimal atelectasis at RIGHT base. No pneumothorax. Osseous structures stable. IMPRESSION: LEFT pleural effusion with increased atelectasis versus consolidation at LEFT lung base. Mild RIGHT basilar atelectasis. RIGHT chest tube and question pericardial drain without pneumothorax. Electronically Signed   By: Lavonia Dana M.D.   On: 03/19/2019 08:18   Dg Chest Port 1 View  Result Date: 03/18/2019 CLINICAL DATA:  Encounter for central line placement. Pneumothorax. EXAM: PORTABLE CHEST 1 VIEW COMPARISON:  Radiograph and CT yesterday FINDINGS: Right internal jugular central venous catheter tip in the mid SVC. No visualized pneumothorax. Presumed mediastinal drain projects over the central chest. Similar enlargement of the cardiac/pericardiac silhouette. Overall lower lung volumes with bronchovascular crowding. Left pleural effusion. IMPRESSION: 1. Right internal jugular central venous catheter tip in the mid SVC. No pneumothorax. 2. Lower lung volumes with bronchovascular crowding. Left pleural effusion. 3. Presumed mediastinal drain in place. Unchanged cardiomegaly/enlarged pericardial silhouette, pericardial effusion on CT. Electronically Signed   By: Keith Rake M.D.   On: 03/18/2019 22:13    Cardiac Studies  TTE  1. Left ventricular ejection fraction, by visual estimation, is 60 to 65%. The left ventricle has normal function. Normal left ventricular size. There is mildly increased left ventricular hypertrophy.  2. Left ventricular diastolic Doppler parameters are consistent with impaired relaxation pattern of LV diastolic filling.  3. Global right ventricle has normal systolic function.The right ventricular size is normal.  4. Right atrial size was normal.  5. Large pericardial effusion.  6. The mitral valve is normal in structure. No evidence of mitral valve regurgitation. No evidence of mitral  stenosis.  7. The tricuspid valve is not well visualized. Tricuspid valve regurgitation was not visualized by color flow Doppler.  8. The aortic valve is normal in structure. Aortic valve regurgitation was not visualized by color flow Doppler. Structurally normal aortic valve, with no evidence of sclerosis or stenosis.  9. The pulmonic valve was normal in structure. Pulmonic valve regurgitation is not visualized by color flow Doppler. 10. The inferior vena cava is dilated in size with <50% respiratory variability, suggesting right atrial pressure  of 15 mmHg. 11. Normal LV systolic function; grade 1 diastolic dysfunction; large pericardial effusion; respiratory variation, dilated IVC and RV diastolic collapse noted suggesting tamponade physiology; mild LVH; results relayed to Melina Copa and cardiology consult  pending.  Patient Profile  Steven Tapia is a 64 y.o. male with medical history significant for diabetes, hypertension, pulmonary embolism diagnosed in January 2020.  He was admitted on 9/29 with tachycardia and found to have a large pericardial effusion and findings consistent with pericardial tamponade.  Assessment & Plan   1.  Pericardial effusion with tamponade - He is status post drainage of pericardial effusion with pericardial window on 9/29 with CT surgery.  We were unable to perform a pericardiocentesis due to difficult windows percutaneously. - Rather unclear source but likely related to coumadin; will hold for now - CRP ~3, ESR 35, mildly elevated but no symptoms of pericarditis prior to this - EKG without features of pericarditis - TSH  Normal and ANA pending - culture from pericardial fluid shows WBCs/PMN but culture negative so far - pending labs: gram stain, cytology, protein, pathology evaluation - INR was 2.6 and only real abnormality on labs to explain this; hemorrhagic? - CTS managing chest tube and will defer this management to them - really no strong evidence to  suggest this is inflammatory, or infectious; above work-up pending - limited TTE to assess pericardial space - will give IV lasix 20 mg due to elevated JVD and edema on CXR  2. Atrial fibrillation  - 2/2 to procedure/effusion  - will keep IV amiodarone today and expect him to covert - will transition to PO amiodarone for load after this - no AC  3. HTN - hold home agents for now - will restart as able  4. DM - A1c 6.6  - SSI - metformin at discharge   5. 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 Audie Box, [place]  03/19/2019 8:40 AM

## 2019-03-19 NOTE — Progress Notes (Signed)
  Echocardiogram 2D Echocardiogram has been performed.  Steven Tapia 03/19/2019, 9:18 AM

## 2019-03-19 NOTE — Evaluation (Signed)
Physical Therapy Evaluation Patient Details Name: Steven Tapia MRN: [id number] DOB: September 19, 1955 Today's Date: 03/19/2019   History of Present Illness  Pt is a 63 year old man with hx of DM, HTN and PE admitted on 03/18/19 with tachycardia and large pericardial effusion with findings consistent with pericardial tamponade. Underwent pericardial window on 9/29.,  Clinical Impression  Patient presents with mild deficits in mobility due to lines/bedrest.  Currently minguard for ambulation in hallway.  Was working as truck TEFL teacher.  Feel he will benefit from skilled PT in the acute setting to allow d/c home with family support.  No follow PT likely needed at d/c.     Follow Up Recommendations No PT follow up    Equipment Recommendations  None recommended by PT    Recommendations for Other Services       Precautions / Restrictions Precautions Precaution Comments: chest tube      Mobility  Bed Mobility Overal bed mobility: Independent             General bed mobility comments: in chair s/p OT  Transfers Overall transfer level: Needs assistance Equipment used: Rolling walker (2 wheeled) Transfers: Sit to/from Stand Sit to Stand: Supervision Stand pivot transfers: Supervision       General transfer comment: assist for lines/safety  Ambulation/Gait Ambulation/Gait assistance: Supervision;Min guard Gait Distance (Feet): 130 Feet Assistive device: Rolling walker (2 wheeled) Gait Pattern/deviations: Step-through pattern;Decreased stride length     General Gait Details: assist for safety and walker used mainly to hold chest tube  Stairs            Wheelchair Mobility    Modified Rankin (Stroke Patients Only)       Balance Overall balance assessment: Mild deficits observed, not formally tested                                           Pertinent Vitals/Pain Pain Assessment: Faces Faces Pain Scale: Hurts little more Pain Location: chest  tube site Pain Descriptors / Indicators: Sore Pain Intervention(s): Monitored during session    Home Living Family/patient expects to be discharged to:: Private residence Living Arrangements: Spouse/significant other Available Help at Discharge: Family;Available 24 hours/day Type of Home: Apartment Home Access: Level entry     Home Layout: One level Home Equipment: None      Prior Function Level of Independence: Independent         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 Dominance   Dominant Hand: Right    Extremity/Trunk Assessment   Upper Extremity Assessment Upper Extremity Assessment: Defer to OT evaluation    Lower Extremity Assessment Lower Extremity Assessment: Overall WFL for tasks assessed    Cervical / Trunk Assessment Cervical / Trunk Assessment: Normal  Communication   Communication: No difficulties  Cognition Arousal/Alertness: Awake/alert Behavior During Therapy: WFL for tasks assessed/performed Overall Cognitive Status: Within Functional Limits for tasks assessed                                        General Comments      Exercises     Assessment/Plan    PT Assessment Patient needs continued PT services  PT Problem List Decreased activity tolerance;Decreased mobility;Decreased balance;Decreased knowledge of use of DME;Cardiopulmonary status limiting activity  PT Treatment Interventions DME instruction;Balance training;Gait training;Functional mobility training;Therapeutic exercise;Patient/family education;Therapeutic activities    PT Goals (Current goals can be found in the Care Plan section)  Acute Rehab PT Goals Patient Stated Goal: to return to independent PT Goal Formulation: With patient Time For Goal Achievement: 04/02/19 Potential to Achieve Goals: Good    Frequency Min 3X/week   Barriers to discharge        Co-evaluation               AM-PAC PT "6 Clicks" Mobility  Outcome Measure Help  needed turning from your back to your side while in a flat bed without using bedrails?: None Help needed moving from lying on your back to sitting on the side of a flat bed without using bedrails?: None Help needed moving to and from a bed to a chair (including a wheelchair)?: A Little Help needed standing up from a chair using your arms (e.g., wheelchair or bedside chair)?: None Help needed to walk in hospital room?: A Little Help needed climbing 3-5 steps with a railing? : A Little 6 Click Score: 21    End of Session Equipment Utilized During Treatment: Oxygen Activity Tolerance: Patient tolerated treatment well Patient left: in chair;with call bell/phone within reach   PT Visit Diagnosis: Muscle weakness (generalized) (M62.81)    Time: 6962-9528 PT Time Calculation (min) (ACUTE ONLY): 21 min   Charges:   PT Evaluation $PT Eval Low Complexity: [place], [place] Acute Rehabilitation Services [phone] 03/19/2019   Reginia Naas 03/19/2019, 10:44 AM

## 2019-03-19 NOTE — Progress Notes (Signed)
      [address]       [place] [address]             [phone]      1 Day Post-Op Procedure(s) (LRB): RIGHT VIDEO ASSISTED THORACOSCOPY (VATS) PERICARDIAL WINDOW (Right)   Subjective:  No specific complaints.  Denies N/V  Pain is controlled, states doesn't have much.   Objective: Vital signs in last 24 hours: Temp:  [97.8 F (36.6 C)-98.9 F (37.2 C)] 98 F (36.7 C) (09/30 0334) Pulse Rate:  [52-110] 52 (09/30 0600) Cardiac Rhythm: Atrial fibrillation (09/30 0300) Resp:  [4-39] 22 (09/30 0600) BP: (83-139)/(53-91) 118/61 (09/30 0600) SpO2:  [93 %-100 %] 98 % (09/30 0600) Arterial Line BP: (106-175)/(51-71) 162/66 (09/30 0600) Weight:  [133.3 kg-133.5 kg] 133.3 kg (09/30 0500)  Intake/Output from previous day: 09/29 0701 - 09/30 0700 In: 3553.3 [I.V.:2743.2; Blood:710; IV Piggyback:100.1] Out: 1080 [Urine:650; Blood:100; Chest Tube:330]  General appearance: alert, cooperative and no distress Heart: irregularly irregular rhythm Lungs: diminished breath sounds left base Abdomen: soft, non-tender; bowel sounds normal; no masses,  no organomegaly Extremities: extremities normal, atraumatic, no cyanosis or edema Wound: clean and dry  Lab Results: Recent Labs    03/18/19 2143 03/19/19 0616  WBC 5.5 5.6  HGB 9.3* 9.8*  HCT 29.5* 29.5*  PLT 276 267   BMET:  Recent Labs    03/18/19 1116 03/19/19 0616  NA 134* 133*  K 3.8 4.0  CL 100 102  CO2 27 23  GLUCOSE 118* 149*  BUN 16 17  CREATININE 0.94 0.97  CALCIUM 8.6* 7.4*    PT/INR:  Recent Labs    03/19/19 0616  LABPROT 18.2*  INR 1.5*   ABG No results found for: PHART, HCO3, TCO2, ACIDBASEDEF, O2SAT CBG (last 3)  Recent Labs    03/18/19 2108 03/18/19 2345 03/19/19 0625  GLUCAP 136* 153* 137*    Assessment/Plan: S/P Procedure(s) (LRB): RIGHT VIDEO ASSISTED THORACOSCOPY (VATS) PERICARDIAL WINDOW (Right)  1. Chest tube- no air leak, CT output 330- leave in place today 2.  CV- rate controlled A. Fib, currently on Amiodarone drip, will d/c and transition to low dose oral Amiodarone, H/O HTN labile currently will monitor and restart antihypertensives as indicated 3. Pulm- CXR with small left pleural effusion, atelectasis, continue IS 4. Renal- creatinine WNL 5. Expected post operative blood loss anemia, Mild Hgb at 9.8 6. Dispo- patient stable, transition to oral Amiodarone, leave chest tubes in place today, d/c arterial line, decrease IV fluids, continue current care, defer timing of coumadin to Dr. Kipp Brood   LOS: 1 day    Ellwood Handler 03/19/2019

## 2019-03-20 ENCOUNTER — Encounter (HOSPITAL_COMMUNITY): Payer: Self-pay | Admitting: Cardiology

## 2019-03-20 ENCOUNTER — Other Ambulatory Visit: Payer: Self-pay

## 2019-03-20 ENCOUNTER — Inpatient Hospital Stay (HOSPITAL_COMMUNITY): Payer: BC Managed Care – PPO | Admitting: Critical Care Medicine

## 2019-03-20 ENCOUNTER — Inpatient Hospital Stay (HOSPITAL_COMMUNITY): Payer: BC Managed Care – PPO

## 2019-03-20 ENCOUNTER — Encounter (HOSPITAL_COMMUNITY)
Admission: EM | Disposition: A | Discharge status: Home / Self Care | Payer: Self-pay | Source: Home / Self Care | Attending: Cardiovascular Disease

## 2019-03-20 DIAGNOSIS — I480 Paroxysmal atrial fibrillation: Secondary | ICD-10-CM

## 2019-03-20 HISTORY — PX: CARDIOVERSION: SHX1299

## 2019-03-20 LAB — CBC
HCT: 31.3 % — ABNORMAL LOW (ref 39.0–52.0)
Hemoglobin: 9.9 g/dL — ABNORMAL LOW (ref 13.0–17.0)
MCH: 26.7 pg (ref 26.0–34.0)
MCHC: 31.6 g/dL (ref 30.0–36.0)
MCV: 84.4 fL (ref 80.0–100.0)
Platelets: 282 10*3/uL (ref 150–400)
RBC: 3.71 MIL/uL — ABNORMAL LOW (ref 4.22–5.81)
RDW: 16 % — ABNORMAL HIGH (ref 11.5–15.5)
WBC: 4.4 10*3/uL (ref 4.0–10.5)
nRBC: 0 % (ref 0.0–0.2)

## 2019-03-20 LAB — COMPREHENSIVE METABOLIC PANEL
ALT: 36 U/L (ref 0–44)
AST: 22 U/L (ref 15–41)
Albumin: 2.6 g/dL — ABNORMAL LOW (ref 3.5–5.0)
Alkaline Phosphatase: 69 U/L (ref 38–126)
Anion gap: 5 (ref 5–15)
BUN: 23 mg/dL (ref 8–23)
CO2: 28 mmol/L (ref 22–32)
Calcium: 8.1 mg/dL — ABNORMAL LOW (ref 8.9–10.3)
Chloride: 105 mmol/L (ref 98–111)
Creatinine, Ser: 1.32 mg/dL — ABNORMAL HIGH (ref 0.61–1.24)
GFR calc Af Amer: >60 mL/min (ref >60)
GFR calc non Af Amer: 58 mL/min — ABNORMAL LOW (ref >60)
Glucose, Bld: 122 mg/dL — ABNORMAL HIGH (ref 70–99)
Potassium: 3.6 mmol/L (ref 3.5–5.1)
Sodium: 138 mmol/L (ref 135–145)
Total Bilirubin: 0.6 mg/dL (ref 0.3–1.2)
Total Protein: 6.4 g/dL — ABNORMAL LOW (ref 6.5–8.1)

## 2019-03-20 LAB — BODY FLUID CELL COUNT WITH DIFFERENTIAL
Eos, Fluid: 1 %
Lymphs, Fluid: 86 %
Monocyte-Macrophage-Serous Fluid: 4 % — ABNORMAL LOW (ref 50–90)
Neutrophil Count, Fluid: 9 % (ref 0–25)
Total Nucleated Cell Count, Fluid: 34500 cu mm — ABNORMAL HIGH (ref 0–1000)

## 2019-03-20 LAB — PROTIME-INR
INR: 1.5 — ABNORMAL HIGH (ref 0.8–1.2)
Prothrombin Time: 18.3 seconds — ABNORMAL HIGH (ref 11.4–15.2)

## 2019-03-20 SURGERY — CARDIOVERSION
Anesthesia: General

## 2019-03-20 MED ORDER — AMIODARONE HCL 200 MG PO TABS: 200.0000 mg | ORAL_TABLET | Freq: Every day | ORAL | Status: DC

## 2019-03-20 MED ORDER — SODIUM CHLORIDE 0.9% FLUSH
3.0000 mL | Freq: Two times a day (BID) | INTRAVENOUS | Status: DC
  Administered 2019-03-20 – 2019-03-22 (×4): 3 mL via INTRAVENOUS

## 2019-03-20 MED ORDER — LIDOCAINE 2% (20 MG/ML) 5 ML SYRINGE
INTRAMUSCULAR | Status: DC | PRN
  Administered 2019-03-20: 60 mg via INTRAVENOUS

## 2019-03-20 MED ORDER — AMIODARONE HCL 200 MG PO TABS
400.0000 mg | ORAL_TABLET | Freq: Two times a day (BID) | ORAL | Status: DC
  Administered 2019-03-20 – 2019-03-22 (×5): 400 mg via ORAL
  Filled 2019-03-20 (×5): qty 2

## 2019-03-20 MED ORDER — POTASSIUM CHLORIDE CRYS ER 20 MEQ PO TBCR
20.0000 meq | EXTENDED_RELEASE_TABLET | ORAL | Status: AC
  Administered 2019-03-20 (×2): 20 meq via ORAL
  Filled 2019-03-20 (×2): qty 1

## 2019-03-20 MED ORDER — SODIUM CHLORIDE 0.9 % IV SOLN: 250.0000 mL | INTRAVENOUS | Status: DC

## 2019-03-20 MED ORDER — SODIUM CHLORIDE 0.9% FLUSH: 3.0000 mL | INTRAVENOUS | Status: DC | PRN

## 2019-03-20 MED ORDER — PROPOFOL 10 MG/ML IV BOLUS
INTRAVENOUS | Status: DC | PRN
  Administered 2019-03-20 (×2): 20 mg via INTRAVENOUS
  Administered 2019-03-20: 50 mg via INTRAVENOUS

## 2019-03-20 MED ORDER — APIXABAN 5 MG PO TABS
5.0000 mg | ORAL_TABLET | Freq: Two times a day (BID) | ORAL | Status: DC
  Administered 2019-03-20 – 2019-03-22 (×5): 5 mg via ORAL
  Filled 2019-03-20 (×5): qty 1

## 2019-03-20 MED ORDER — FUROSEMIDE 10 MG/ML IJ SOLN
40.0000 mg | Freq: Once | INTRAMUSCULAR | Status: AC
  Administered 2019-03-20: 10:00:00 40 mg via INTRAVENOUS
  Filled 2019-03-20: qty 4

## 2019-03-20 NOTE — Interval H&P Note (Signed)
History and Physical Interval Note:  03/20/2019 12:33 PM  Steven Tapia  has presented today for surgery, with the diagnosis of Afib.  The various methods of treatment have been discussed with the patient and family. After consideration of risks, benefits and other options for treatment, the patient has consented to  Procedure(s): CARDIOVERSION (N/A) as a surgical intervention.  The patient's history has been reviewed, patient examined, no change in status, stable for surgery.  I have reviewed the patient's chart and labs.  Questions were answered to the patient's satisfaction.     Steven Tapia   Of note, I spoke to both Dr. Audie Box and the patient regarding anticoagulation. He has been in atrial fibrillation less than 48 hours, but he only received his first DOAC dose this AM. He was on anticoagulation prior to admission due to DVT, not afib. He is now s/p pericardial window for hemorrhagic pericardial effusion. Plan is to continue anticoagulation indefinitely.  CHA2DS2/VAS Stroke Risk Points=4 (DM, VTEx2, HTN) which is not low risk. I discussed with both that while he had only been in afib for <48 hours, he will not be therapeutic for another 24 hours. Both the patient and Dr. Audie Box are ok to proceed with cardioversion as planned.

## 2019-03-20 NOTE — Anesthesia Procedure Notes (Signed)
Procedure Name: General with mask airway Performed by: Carys Malina B, CRNA Pre-anesthesia Checklist: Patient identified, Emergency Drugs available, Suction available, Patient being monitored and Timeout performed Patient Re-evaluated:Patient Re-evaluated prior to induction Oxygen Delivery Method: Ambu bag Preoxygenation: Pre-oxygenation with 100% oxygen Induction Type: IV induction Placement Confirmation: positive ETCO2 Dental Injury: Teeth and Oropharynx as per pre-operative assessment        

## 2019-03-20 NOTE — Progress Notes (Signed)
Pt returned to unit via wheelchair after successful cardioversion.

## 2019-03-20 NOTE — Progress Notes (Signed)
Pt picked up by Larkin Ina, endo tech to go to endoscopy for cardioversion.  Pt transported with tele monitor and IV pumps via wheelchair.

## 2019-03-20 NOTE — Progress Notes (Signed)
Transitions of Care Pharmacist Note  Steven Tapia is a 63 y.o. male that has been diagnosed with a PE and will be prescribed Eliquis (apixaban) at discharge.   Patient Education: I provided the following education on 10/1 to the patient: How to take the medication Described what the medication is Signs of bleeding Signs/symptoms of VTE and stroke  Answered their questions  Discharge Medications Plan: The patient wants to have their discharge medications filled by the Transitions of Care pharmacy rather than their usual pharmacy.  The primary doctor for this patient has been contacted to send all discharge medication prescriptions to the Transitions of Care pharmacy, the discharge orders pharmacy has been changed to the Transitions of Care pharmacy, the patient will receive a phone call regarding co-pay, and their medications will be delivered by the Transitions of Care pharmacy.   Insurance information: N/A   Thank you for allowing pharmacy to participate in this patient's care.  Jolleen Seman L. Devin Going, PharmD, [facility] PGY1 Pharmacy Resident 03/20/19     8:47 PM  Please check AMION for all [facility] phone numbers After 10:00 PM, call the [facility] [phone]

## 2019-03-20 NOTE — Progress Notes (Signed)
RT placed pt on CPAP dream station for the night on home setting of 8cmH2O w/no oxygen bled into the system. Pt respiratory status is stable at this time. RT will continue to monitor.

## 2019-03-20 NOTE — Progress Notes (Signed)
Cardiology Progress Note  Patient ID: Steven Tapia MRN: [id number] DOB: [birth year] Date of Encounter: 03/20/2019  Primary Cardiologist: Evalina Field, MD  Subjective  Cardiac surgery with plans for chest tube removal today.  He remains in atrial fibrillation.  He reports no chest pain or trouble breathing.  Echocardiogram yesterday shows continued resolution of his pericardial effusion.  ROS:  All other ROS reviewed and negative. Pertinent positives noted in the HPI.     Inpatient Medications  Scheduled Meds: . atorvastatin  20 mg Oral Daily  . bisacodyl  10 mg Oral Daily  . Chlorhexidine Gluconate Cloth  6 each Topical Daily  . docusate sodium  100 mg Oral BID  . ketorolac  15 mg Intravenous Q6H  . potassium chloride  20 mEq Oral Q4H  . senna-docusate  1 tablet Oral QHS  . sodium chloride flush  3 mL Intravenous Q12H   Continuous Infusions: . sodium chloride    . 0.9 % NaCl with KCl 20 mEq / L 10 mL/hr at 03/20/19 0500  . amiodarone 30 mg/hr (03/20/19 0630)   PRN Meds: sodium chloride, [DISCONTINUED] acetaminophen **OR** acetaminophen, acetaminophen, morphine injection, ondansetron **OR** ondansetron (ZOFRAN) IV, ondansetron (ZOFRAN) IV, sodium chloride flush, traMADol   Vital Signs   Vitals:   03/20/19 0500 03/20/19 0600 03/20/19 0700 03/20/19 0800  BP: (!) 141/87 (!) 147/78 118/66 126/71  Pulse: 73 78 (!) 101 85  Resp: (!) 24 (!) 26 (!) 24 (!) 28  Temp:      TempSrc:      SpO2: 94% 95% 92% 93%  Weight: 134.6 kg     Height:        Intake/Output Summary (Last 24 hours) at 03/20/2019 0841 Last data filed at 03/20/2019 0800 Gross per 24 hour  Intake 1715.61 ml  Output 260 ml  Net 1455.61 ml   Last 3 Weights 03/20/2019 03/19/2019 03/18/2019  Weight (lbs) 296 lb 11.8 oz 293 lb 14 oz 294 lb 5 oz  Weight (kg) 134.6 kg 133.3 kg 133.5 kg      Telemetry  Overnight telemetry shows atrial fibrillation with heart rate in the 70-90 range, which I personally reviewed.    ECG  The most recent ECG shows atrial fibrillation with heart rate 65, right bundle branch block noted, which I personally reviewed.   Physical Exam   Vitals:   03/20/19 0500 03/20/19 0600 03/20/19 0700 03/20/19 0800  BP: (!) 141/87 (!) 147/78 118/66 126/71  Pulse: 73 78 (!) 101 85  Resp: (!) 24 (!) 26 (!) 24 (!) 28  Temp:      TempSrc:      SpO2: 94% 95% 92% 93%  Weight: 134.6 kg     Height:         Intake/Output Summary (Last 24 hours) at 03/20/2019 0841 Last data filed at 03/20/2019 0800 Gross per 24 hour  Intake 1715.61 ml  Output 260 ml  Net 1455.61 ml    Last 3 Weights 03/20/2019 03/19/2019 03/18/2019  Weight (lbs) 296 lb 11.8 oz 293 lb 14 oz 294 lb 5 oz  Weight (kg) 134.6 kg 133.3 kg 133.5 kg    Body mass index is 36.12 kg/m.  General: Well nourished, well developed, in no acute distress Head: Atraumatic, normal size  Eyes: PEERLA, EOMI  Neck: Supple, no JVD Endocrine: No thryomegaly Cardiac: Irregular rhythm, pericardial rub noted Lungs: Diminished breath sounds at the bases Abd: Soft, nontender, no hepatomegaly  Ext: No edema, pulses 2+ Musculoskeletal: No deformities, BUE and  BLE strength normal and equal Skin: Warm and dry, no rashes   Neuro: Alert and oriented to person, place, time, and situation, CNII-XII grossly intact, no focal deficits  Psych: Normal mood and affect   Labs  High Sensitivity Troponin:   Recent Labs  Lab 03/17/19 1039 03/17/19 1239  TROPONINIHS 3 3     Cardiac EnzymesNo results for input(s): TROPONINI in the last 168 hours. No results for input(s): TROPIPOC in the last 168 hours.  Chemistry Recent Labs  Lab 03/18/19 1116 03/19/19 0616 03/19/19 1408 03/20/19 0506  NA 134* 133* 137 138  K 3.8 4.0 3.4* 3.6  CL 100 102 101 105  CO2 '27 23 26 28  '$ GLUCOSE 118* 149* 158* 122*  BUN '16 17 16 23  '$ CREATININE 0.94 0.97 1.01 1.32*  CALCIUM 8.6* 7.4* 7.6* 8.1*  PROT 6.8  --  6.3* 6.4*  ALBUMIN 3.0*  --  2.7* 2.6*  AST 21  --  24 22   ALT 43  --  38 36  ALKPHOS 82  --  66 69  BILITOT 0.9  --  1.2 0.6  GFRNONAA >60 >60 >60 58*  GFRAA >60 >60 >60 >60  ANIONGAP '7 8 10 5    '$ Hematology Recent Labs  Lab 03/18/19 2143 03/19/19 0616 03/20/19 0506  WBC 5.5 5.6 4.4  RBC 3.54* 3.62* 3.71*  HGB 9.3* 9.8* 9.9*  HCT 29.5* 29.5* 31.3*  MCV 83.3 81.5 84.4  MCH 26.3 27.1 26.7  MCHC 31.5 33.2 31.6  RDW 16.4* 15.9* 16.0*  PLT 276 267 282   BNP Recent Labs  Lab 03/18/19 0716  BNP 13.8    DDimer No results for input(s): DDIMER in the last 168 hours.   Radiology  Dg Chest Port 1 View  Result Date: 03/20/2019 CLINICAL DATA:  Shortness of breath, pleural effusion. EXAM: PORTABLE CHEST 1 VIEW COMPARISON:  Radiograph of March 19, 2019. FINDINGS: Stable cardiomegaly. Right internal jugular catheter is unchanged in position. Right-sided chest tube is noted. No pneumothorax is noted. Minimal right basilar subsegmental atelectasis is noted. Stable left pleural effusion is noted with associated atelectasis or infiltrate. Probable pericardial effusion is noted. Bony thorax is unremarkable. IMPRESSION: Stable left pleural effusion is noted with associated atelectasis or infiltrate. Stable position of right-sided chest tube without pneumothorax. Minimal right basilar subsegmental atelectasis. Electronically Signed   By: Marijo Conception M.D.   On: 03/20/2019 08:23   Dg Chest Port 1 View  Result Date: 03/19/2019 CLINICAL DATA:  Chest tube, pneumothorax EXAM: PORTABLE CHEST 1 VIEW COMPARISON:  Portable exam 0540 hours compared to 03/18/2019 FINDINGS: RIGHT chest tube unchanged. Additional drainage tube projects over the cardiac silhouette question pericardial. RIGHT jugular central venous catheter with tip projecting over SVC near cavoatrial junction. Enlargement of cardiac silhouette with pulmonary vascular congestion. Increased opacity at the LEFT lung base likely representing a combination of pleural effusion with increased atelectasis  versus consolidation in the LEFT lower lobe and lingula. Minimal atelectasis at RIGHT base. No pneumothorax. Osseous structures stable. IMPRESSION: LEFT pleural effusion with increased atelectasis versus consolidation at LEFT lung base. Mild RIGHT basilar atelectasis. RIGHT chest tube and question pericardial drain without pneumothorax. Electronically Signed   By: Lavonia Dana M.D.   On: 03/19/2019 08:18   Dg Chest Port 1 View  Result Date: 03/18/2019 CLINICAL DATA:  Encounter for central line placement. Pneumothorax. EXAM: PORTABLE CHEST 1 VIEW COMPARISON:  Radiograph and CT yesterday FINDINGS: Right internal jugular central venous catheter tip in the  mid SVC. No visualized pneumothorax. Presumed mediastinal drain projects over the central chest. Similar enlargement of the cardiac/pericardiac silhouette. Overall lower lung volumes with bronchovascular crowding. Left pleural effusion. IMPRESSION: 1. Right internal jugular central venous catheter tip in the mid SVC. No pneumothorax. 2. Lower lung volumes with bronchovascular crowding. Left pleural effusion. 3. Presumed mediastinal drain in place. Unchanged cardiomegaly/enlarged pericardial silhouette, pericardial effusion on CT. Electronically Signed   By: Keith Rake M.D.   On: 03/18/2019 22:13    Cardiac Studies   TTE 03/18/2019  1. Left ventricular ejection fraction, by visual estimation, is 60 to 65%. The left ventricle has normal function. Normal left ventricular size. There is mildly increased left ventricular hypertrophy. Normal wall motion. D-shaped interventricular septum  suggests RV pressure/volume overload.  2. Global right ventricle has moderately reduced systolic function.The right ventricular size is moderately enlarged. No increase in right ventricular wall thickness.  3. The left atrium was mildly dilated.  4. Right atrial size was normal.  5. The mitral valve is normal in structure. Trace mitral valve regurgitation. No evidence of  mitral stenosis.  6. The tricuspid valve is normal in structure. Tricuspid valve regurgitation is trivial.  7. The aortic valve is tricuspid Aortic valve regurgitation was not visualized by color flow Doppler. Structurally normal aortic valve, with no evidence of sclerosis or stenosis.  8. The pulmonic valve was not well visualized. Pulmonic valve regurgitation was not assessed by color flow Doppler.  9. The inferior vena cava is normal in size with greater than 50% respiratory variability, suggesting right atrial pressure of 3 mmHg. 10. The tricuspid regurgitant velocity is 2.20 m/s, and with an assumed right atrial pressure of 3 mmHg, the estimated right ventricular systolic pressure is normal at 22.4 mmHg. 11. Pleural effusion noted. There is a trivial pericardial effusion present.   Patient Profile  Steven Tapia is a 63 y.o. male with medical history significant for diabetes, hypertension, pulmonary embolism diagnosed in January 2020.  He was admitted on 9/29 with tachycardia and found to have a large pericardial effusion and findings consistent with pericardial tamponade.  Assessment & Plan   1.  Pericardial effusion with tamponade - He is status post drainage of pericardial effusion with pericardial window on 9/29 with CT surgery.  We were unable to perform a pericardiocentesis due to difficult windows percutaneously. - Rather unclear source but likely related to coumadin; will hold for now - CRP ~3, ESR 35, mildly elevated but no symptoms of pericarditis prior to this - EKG without features of pericarditis - TSH  Normal and ANA normal - culture from pericardial fluid shows WBCs/PMN but culture negative so far - pending labs: gram stain, cytology, protein, pathology evaluation - INR was 2.6 and only real abnormality on labs to explain this; hemorrhagic? - CTS managing chest tube and will defer this management to them - really no strong evidence to suggest this is inflammatory, or  infectious; above work-up pending -Repeat echocardiogram on 9/30 shows resolution of effusion, he does have moderately reduced RV function and appears dilated - will give IV lasix 20 mg due to elevated JVD and edema on CXR  2. Atrial fibrillation, post operative, CHADSVASC = 2, <48 hour duration  - 2/2 to procedure/effusion  -He presented in sinus rhythm on this admission, and developed this postoperatively around 10 PM to 11 PM on 9/29; he is therefore within a 48-hour window to proceed with cardioversion without a TEE first.  His CHADSVASC = 2 and I would  favor anticoagulation post procedure for at least 1 month.  Given his PE that appears to be unprovoked he is a candidate for lifelong anticoagulation which we will likely resume as long as this is okay per CT surgery with recent procedure. - will keep IV amiodarone for now and transition to oral load (400 mg BID x 7 days) and then 200 mg daily for 3 weeks (1 month total of therapy and stop, this is not a long-term medication)  -I will keep him n.p.o. for likely cardioversion in the ICU today -I will wait to see if removal his chest tube resulted in conversion to sinus rhythm prior to attempt at this  3.  Dilated right ventricle with moderately reduced function -I reviewed his pulmonary embolism study from January, and he was noted to have evidence of RV overload at that time. -I suspect this is just the result of his prior pulmonary embolism, he likely has OSA as well -RVSP was around 23 mmHg on his recent echocardiogram and his IVC is collapsible -We will give him another round of 40 mg of Lasix today -I suspect we will just monitor this for now and I will talk with him about a sleep study as an outpatient  4. HTN - hold home agents for now - will restart as able  5. DM - A1c 6.6  - SSI - metformin at discharge   6. 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 Audie Box, [place]  03/20/2019 8:41 AM

## 2019-03-20 NOTE — Discharge Instructions (Signed)
Discharge Instructions:  1. You may shower, please wash incisions daily with soap and water and keep dry.  If you wish to cover wounds with dressing you may do so but please keep clean and change daily.  No tub baths or swimming until incisions have completely healed.  If your incisions become red or develop any drainage please call our office at [phone]  2. No Driving until cleared by  Dr. Abran Duke office and you are no longer using narcotic pain medications  3. Fever of 101.5 for at least 24 hours with no source, please contact our office at [phone]  4. Activity- up as tolerated, please walk at least 3 times per day.  Avoid strenuous activity, no lifting, pushing, or pulling with your arms over 8-10 lbs for a minimum of 6 weeks  5. If any questions or concerns arise, please do not hesitate to contact our office at [phone]     Information on my medicine - ELIQUIS (apixaban)  This medication education was reviewed with me or my healthcare representative as part of my discharge preparation.  The pharmacist that spoke with me during my hospital stay was:  Lajean Saver, [place]  Why was Eliquis prescribed for you? Eliquis was prescribed for you to reduce the risk of forming blood clots that can cause a stroke if you have a medical condition called atrial fibrillation (a type of irregular heartbeat) OR to reduce the risk of a blood clots forming after orthopedic surgery.  What do You need to know about Eliquis ? Take your Eliquis TWICE DAILY - one tablet in the morning and one tablet in the evening with or without food.  It would be best to take the doses about the same time each day.  If you have difficulty swallowing the tablet whole please discuss with your pharmacist how to take the medication safely.  Take Eliquis exactly as prescribed by your doctor and DO NOT stop taking Eliquis without talking to the doctor who prescribed the medication.  Stopping may increase your  risk of developing a new clot or stroke.  Refill your prescription before you run out.  After discharge, you should have regular check-up appointments with your healthcare provider that is prescribing your Eliquis.  In the future your dose may need to be changed if your kidney function or weight changes by a significant amount or as you get older.  What do you do if you miss a dose? If you miss a dose, take it as soon as you remember on the same day and resume taking twice daily.  Do not take more than one dose of ELIQUIS at the same time.  Important Safety Information A possible side effect of Eliquis is bleeding. You should call your healthcare provider right away if you experience any of the following: ? Bleeding from an injury or your nose that does not stop. ? Unusual colored urine (red or dark brown) or unusual colored stools (red or black). ? Unusual bruising for unknown reasons. ? A serious fall or if you hit your head (even if there is no bleeding).  Some medicines may interact with Eliquis and might increase your risk of bleeding or clotting while on Eliquis. To help avoid this, consult your healthcare provider or pharmacist prior to using any new prescription or non-prescription medications, including herbals, vitamins, non-steroidal anti-inflammatory drugs (NSAIDs) and supplements.  This website has more information on Eliquis (apixaban): [email or web address].

## 2019-03-20 NOTE — Transfer of Care (Signed)
Immediate Anesthesia Transfer of Care Note  Patient: Steven Tapia  Procedure(s) Performed: CARDIOVERSION (N/A )  Patient Location: Endoscopy Unit  Anesthesia Type:General  Level of Consciousness: awake and alert   Airway & Oxygen Therapy: Patient Spontanous Breathing  Post-op Assessment: Report given to RN and Post -op Vital signs reviewed and stable  Post vital signs: Reviewed and stable  Last Vitals:  Vitals Value Taken Time  BP    Temp    Pulse    Resp    SpO2      Last Pain:  Vitals:   03/20/19 1243  TempSrc: Oral  PainSc: 0-No pain      Patients Stated Pain Goal: 3 (65/03/54 6568)  Complications: No apparent anesthesia complications

## 2019-03-20 NOTE — Progress Notes (Signed)
      [address]       [place] [address]             [phone]                 2 Days Post-Op Procedure(s) (LRB): RIGHT VIDEO ASSISTED THORACOSCOPY (VATS) PERICARDIAL WINDOW (Right)   Events: No events.  Symptoms improved _______________________________________________________________ Vitals: BP 136/65   Pulse 99   Temp 98.1 F (36.7 C) (Oral)   Resp (!) 24   Ht 6\' 4"  (1.93 m)   Wt 134.6 kg   SpO2 94%   BMI 36.12 kg/m   - Neuro: alert NAD  - Cardiovascular: aifb, rate controlled    - Pulm: EWOB    ABG No results found for: PHART, PCO2ART, PO2ART, HCO3, TCO2, ACIDBASEDEF, O2SAT  - Abd: soft, NT, ND - Extremity: warm  .Intake/Output      09/30 0701 - 10/01 0700 10/01 0701 - 10/02 0700   P.O. 780 120   I.V. (mL/kg) 1009.1 (7.5) 78.9 (0.6)   Blood     IV Piggyback 99.9    Total Intake(mL/kg) 1888.9 (14) 198.9 (1.5)   Urine (mL/kg/hr) 200 (0.1) 350 (0.8)   Emesis/NG output 0    Stool 0    Blood     Chest Tube 60 0   Total Output 260 350   Net +1628.9 -151.1        Urine Occurrence 2 x    Stool Occurrence 0 x    Emesis Occurrence 0 x       _______________________________________________________________ Labs: CBC Latest Ref Rng & Units 03/20/2019 03/19/2019 03/18/2019  WBC 4.0 - 10.5 K/uL 4.4 5.6 5.5  Hemoglobin 13.0 - 17.0 g/dL 9.9(L) 9.8(L) 9.3(L)  Hematocrit 39.0 - 52.0 % 31.3(L) 29.5(L) 29.5(L)  Platelets 150 - 400 K/uL 282 267 276   CMP Latest Ref Rng & Units 03/20/2019 03/19/2019 03/19/2019  Glucose 70 - 99 mg/dL 122(H) 158(H) 149(H)  BUN 8 - 23 mg/dL 23 16 17   Creatinine 0.61 - 1.24 mg/dL 1.32(H) 1.01 0.97  Sodium 135 - 145 mmol/L 138 137 133(L)  Potassium 3.5 - 5.1 mmol/L 3.6 3.4(L) 4.0  Chloride 98 - 111 mmol/L 105 101 102  CO2 22 - 32 mmol/L 28 26 23   Calcium 8.9 - 10.3 mg/dL 8.1(L) 7.6(L) 7.4(L)  Total Protein 6.5 - 8.1 g/dL 6.4(L) 6.3(L) -  Total Bilirubin 0.3 - 1.2 mg/dL 0.6 1.2 -  Alkaline Phos 38 - 126 U/L 69 66  -  AST 15 - 41 U/L 22 24 -  ALT 0 - 44 U/L 36 38 -    CXR: pending  _______________________________________________________________  Assessment and Plan: POD 2 s/p R VATS, pericardial window  Neuro: pain controlled CV: stable.  Starting eliquis today.  Will watch drain output, and likely remove tomorrow.  Will cardiovert today Pulm: pulm toilet  Dispo: per primary team  Melodie Bouillon, MD 03/20/2019 10:23 AM

## 2019-03-20 NOTE — Anesthesia Postprocedure Evaluation (Signed)
Anesthesia Post Note  Patient: Steven Tapia  Procedure(s) Performed: CARDIOVERSION (N/A )     Patient location during evaluation: Endoscopy Anesthesia Type: General Level of consciousness: awake and alert Pain management: pain level controlled Vital Signs Assessment: post-procedure vital signs reviewed and stable Respiratory status: spontaneous breathing, nonlabored ventilation and respiratory function stable Cardiovascular status: blood pressure returned to baseline and stable Postop Assessment: no apparent nausea or vomiting Anesthetic complications: no    Last Vitals:  Vitals:   03/20/19 1408 03/20/19 1500  BP: (!) 149/73 (!) 143/81  Pulse: 90 87  Resp: (!) 28 (!) 29  Temp:    SpO2: 95% 99%    Last Pain:  Vitals:   03/20/19 1410  TempSrc:   PainSc: 0-No pain                 Catalina Gravel

## 2019-03-20 NOTE — Anesthesia Preprocedure Evaluation (Addendum)
Anesthesia Evaluation  Patient identified by MRN, date of birth, ID band Patient awake    Reviewed: Allergy & Precautions, NPO status , Patient's Chart, lab work & pertinent test results  History of Anesthesia Complications Negative for: history of anesthetic complications  Airway Mallampati: II  TM Distance: >3 FB Neck ROM: Full    Dental  (+) Teeth Intact, Dental Advisory Given, Missing,    Pulmonary PE   Pulmonary exam normal breath sounds clear to auscultation       Cardiovascular hypertension, Pt. on medications Normal cardiovascular exam+ dysrhythmias Atrial Fibrillation  Rhythm:Regular Rate:Normal     Neuro/Psych negative neurological ROS  negative psych ROS   GI/Hepatic negative GI ROS, Neg liver ROS,   Endo/Other  diabetes, Type 2, Oral Hypoglycemic Agents Obesity   Renal/GU Renal InsufficiencyRenal disease     Musculoskeletal negative musculoskeletal ROS (+)   Abdominal   Peds  Hematology  (+) Blood dyscrasia (Coumadin), anemia ,   Anesthesia Other Findings   Reproductive/Obstetrics                            Anesthesia Physical Anesthesia Plan  ASA: III  Anesthesia Plan: General   Post-op Pain Management:    Induction: Intravenous  PONV Risk Score and Plan: 2 and Treatment may vary due to age or medical condition and Propofol infusion  Airway Management Planned: Mask and Natural Airway  Additional Equipment: None  Intra-op Plan:   Post-operative Plan:   Informed Consent: I have reviewed the patients History and Physical, chart, labs and discussed the procedure including the risks, benefits and alternatives for the proposed anesthesia with the patient or authorized representative who has indicated his/her understanding and acceptance.     Dental advisory given  Plan Discussed with: CRNA and Anesthesiologist  Anesthesia Plan Comments:        Anesthesia  Quick Evaluation

## 2019-03-20 NOTE — Progress Notes (Signed)
[facility] BRIEF SICU PROGRESS NOTE  Day of Surgery  S/P Procedure(s) (LRB): CARDIOVERSION (N/A)   Stable day Cardioverted into NSR Breathing comfortably Trivial chest tube output Excellent UOP  Plan: Continue current plan  Rexene Alberts, MD 03/20/2019 7:01 PM

## 2019-03-20 NOTE — Progress Notes (Signed)
Paged Sande Rives, PA to clarify orders - okay to place patient back on cardiac diet now that procedure is over.  PA to clarify if MD would like to transition pt off amiodarone drip since he is now in NSR.

## 2019-03-20 NOTE — CV Procedure (Signed)
Procedure:   DCCV  Indication:  Symptomatic atrial fibrillation  Procedure Note:  The patient signed informed consent.  He has onset of afib <48 hours, started anticoagulation today, see comments re: anticoagulation discussion in notes.  Anesthesia was administered by Dr. Gifford Shave.  Adequate airway was maintained throughout and vital followed per protocol.  They were cardioverted x 1 with 150J of biphasic synchronized energy.  They converted to NSR.  There were no apparent complications.  The patient had normal neuro status and respiratory status post procedure with vitals stable as recorded elsewhere.    Follow up:  They will continue on current medical therapy and follow up with cardiology as scheduled.  Buford Dresser, MD PhD 03/20/2019 1:15 PM

## 2019-03-21 ENCOUNTER — Inpatient Hospital Stay (HOSPITAL_COMMUNITY): Payer: BC Managed Care – PPO

## 2019-03-21 ENCOUNTER — Encounter (HOSPITAL_COMMUNITY): Payer: Self-pay | Admitting: Cardiology

## 2019-03-21 DIAGNOSIS — I314 Cardiac tamponade: Secondary | ICD-10-CM

## 2019-03-21 LAB — COMPREHENSIVE METABOLIC PANEL
ALT: 35 U/L (ref 0–44)
AST: 21 U/L (ref 15–41)
Albumin: 2.8 g/dL — ABNORMAL LOW (ref 3.5–5.0)
Alkaline Phosphatase: 79 U/L (ref 38–126)
Anion gap: 9 (ref 5–15)
BUN: 21 mg/dL (ref 8–23)
CO2: 26 mmol/L (ref 22–32)
Calcium: 8.2 mg/dL — ABNORMAL LOW (ref 8.9–10.3)
Chloride: 102 mmol/L (ref 98–111)
Creatinine, Ser: 1.05 mg/dL (ref 0.61–1.24)
GFR calc Af Amer: >60 mL/min (ref >60)
GFR calc non Af Amer: >60 mL/min (ref >60)
Glucose, Bld: 124 mg/dL — ABNORMAL HIGH (ref 70–99)
Potassium: 3.6 mmol/L (ref 3.5–5.1)
Sodium: 137 mmol/L (ref 135–145)
Total Bilirubin: 0.7 mg/dL (ref 0.3–1.2)
Total Protein: 6.7 g/dL (ref 6.5–8.1)

## 2019-03-21 LAB — CBC
HCT: 33.5 % — ABNORMAL LOW (ref 39.0–52.0)
HCT: 36.6 % — ABNORMAL LOW (ref 39.0–52.0)
Hemoglobin: 10.9 g/dL — ABNORMAL LOW (ref 13.0–17.0)
Hemoglobin: 11.6 g/dL — ABNORMAL LOW (ref 13.0–17.0)
MCH: 26.8 pg (ref 26.0–34.0)
MCH: 26 pg (ref 26.0–34.0)
MCHC: 32.5 g/dL (ref 30.0–36.0)
MCHC: 31.7 g/dL (ref 30.0–36.0)
MCV: 82.3 fL (ref 80.0–100.0)
MCV: 82.1 fL (ref 80.0–100.0)
Platelets: 302 10*3/uL (ref 150–400)
Platelets: 338 10*3/uL (ref 150–400)
RBC: 4.07 MIL/uL — ABNORMAL LOW (ref 4.22–5.81)
RBC: 4.46 MIL/uL (ref 4.22–5.81)
RDW: 15.9 % — ABNORMAL HIGH (ref 11.5–15.5)
RDW: 15.8 % — ABNORMAL HIGH (ref 11.5–15.5)
WBC: 4 10*3/uL (ref 4.0–10.5)
WBC: 4.5 10*3/uL (ref 4.0–10.5)
nRBC: 0 % (ref 0.0–0.2)
nRBC: 0 % (ref 0.0–0.2)

## 2019-03-21 LAB — SURGICAL PATHOLOGY

## 2019-03-21 LAB — PH, BODY FLUID: pH, Body Fluid: 7.1

## 2019-03-21 LAB — GLUCOSE, CAPILLARY: Glucose-Capillary: 113 mg/dL — ABNORMAL HIGH (ref 70–99)

## 2019-03-21 LAB — GLUCOSE, BODY FLUID OTHER: Glucose, Body Fluid Other: 291 mg/dL

## 2019-03-21 LAB — PROTEIN, BODY FLUID (OTHER): Total Protein, Body Fluid Other: 16.5 g/dL

## 2019-03-21 LAB — LD, BODY FLUID (OTHER): LD, Body Fluid: 1206 IU/L

## 2019-03-21 LAB — CYTOLOGY - NON PAP

## 2019-03-21 MED ORDER — AMLODIPINE BESYLATE 10 MG PO TABS
10.0000 mg | ORAL_TABLET | Freq: Every day | ORAL | Status: DC
  Administered 2019-03-21 – 2019-03-22 (×2): 10 mg via ORAL
  Filled 2019-03-21 (×2): qty 1

## 2019-03-21 MED ORDER — HYDRALAZINE HCL 25 MG PO TABS
25.0000 mg | ORAL_TABLET | Freq: Three times a day (TID) | ORAL | Status: DC
  Administered 2019-03-21 – 2019-03-22 (×4): 25 mg via ORAL
  Filled 2019-03-21 (×4): qty 1

## 2019-03-21 MED ORDER — FUROSEMIDE 10 MG/ML IJ SOLN
20.0000 mg | Freq: Once | INTRAMUSCULAR | Status: AC
  Administered 2019-03-21: 20 mg via INTRAVENOUS
  Filled 2019-03-21: qty 2

## 2019-03-21 NOTE — Progress Notes (Signed)
Patient is alert and with wife no distress, transferred from [facility] post chest tube removal site oozing blood at a moderate amount. Vaseline dressing with pressure applied. MD aware and CBC LAB ordered.

## 2019-03-21 NOTE — Progress Notes (Addendum)
      [address]       [place] [address]             [phone]      1 Day Post-Op Procedure(s) (LRB): CARDIOVERSION (N/A)   Subjective:  On CPAP this morning.   Without complaints.  Objective: Vital signs in last 24 hours: Temp:  [98 F (36.7 C)-98.9 F (37.2 C)] 98.3 F (36.8 C) (10/02 0405) Pulse Rate:  [77-99] 77 (10/02 0700) Cardiac Rhythm: Normal sinus rhythm (10/02 0400) Resp:  [18-35] 18 (10/02 0700) BP: (126-180)/(65-97) 153/89 (10/02 0700) SpO2:  [93 %-99 %] 97 % (10/02 0700) Weight:  [131.6 kg-133.8 kg] 131.6 kg (10/02 0500)  Intake/Output from previous day: 10/01 0701 - 10/02 0700 In: 615.6 [P.O.:290; I.V.:325.6] Out: 3165 [Urine:3150; Chest Tube:15]  General appearance: alert, cooperative and no distress Heart: regular rate and rhythm Lungs: clear to auscultation bilaterally Abdomen: soft, non-tender; bowel sounds normal; no masses,  no organomegaly Extremities: extremities normal, atraumatic, no cyanosis or edema Wound: clean and dry  Lab Results: Recent Labs    03/20/19 0506 03/21/19 0401  WBC 4.4 4.0  HGB 9.9* 10.9*  HCT 31.3* 33.5*  PLT 282 302   BMET:  Recent Labs    03/20/19 0506 03/21/19 0401  NA 138 137  K 3.6 3.6  CL 105 102  CO2 28 26  GLUCOSE 122* 124*  BUN 23 21  CREATININE 1.32* 1.05  CALCIUM 8.1* 8.2*    PT/INR:  Recent Labs    03/20/19 0506  LABPROT 18.3*  INR 1.5*   ABG No results found for: PHART, HCO3, TCO2, ACIDBASEDEF, O2SAT CBG (last 3)  Recent Labs    03/18/19 2108 03/18/19 2345 03/19/19 0625  GLUCAP 136* 153* 137*    Assessment/Plan: S/P Procedure(s) (LRB): CARDIOVERSION (N/A)  1. CV- cardioverted yesterday, in NSR this morning- + HTN- Amiodarone, Norvasc, Eliquis 2. Pulm- chest tube removed yesterday, will get 2V CXR for baseline, post chest tube removal 3. Dispo- patient stable, chest tube removed yesterday, OR culture is negative for bacterial growth to date, cytology  remains pending, care per primary   LOS: 3 days    Junie Panning Barrett PA-C  03/21/2019  Agree with above Doing well  Will remove drain Please call with questions  Garnett Rekowski O Gennavieve Huq

## 2019-03-21 NOTE — Progress Notes (Signed)
Cardiology Progress Note  Patient ID: Steven Tapia MRN: [id number] DOB: 1955-12-21 Date of Encounter: 03/21/2019  Primary Cardiologist: Evalina Field, MD  Subjective  Chest tube was placed to bulb suction yesterday.  15 cc output reported per nursing.  Status post cardioversion yesterday with return to normal sinus rhythm.  He is tolerating Eliquis well.  Good urine output with IV diuresis.  ROS:  All other ROS reviewed and negative. Pertinent positives noted in the HPI.     Inpatient Medications  Scheduled Meds:  amiodarone  400 mg Oral BID   Followed by   Derrill Memo ON 03/27/2019] amiodarone  200 mg Oral Daily   amLODipine  10 mg Oral Daily   apixaban  5 mg Oral BID   atorvastatin  20 mg Oral Daily   bisacodyl  10 mg Oral Daily   Chlorhexidine Gluconate Cloth  6 each Topical Daily   docusate sodium  100 mg Oral BID   furosemide  20 mg Intravenous Once   hydrALAZINE  25 mg Oral Q8H   senna-docusate  1 tablet Oral QHS   sodium chloride flush  3 mL Intravenous Q12H   sodium chloride flush  3 mL Intravenous Q12H   Continuous Infusions:  sodium chloride     sodium chloride     0.9 % NaCl with KCl 20 mEq / L Stopped (03/20/19 1749)   PRN Meds: sodium chloride, [DISCONTINUED] acetaminophen **OR** acetaminophen, acetaminophen, morphine injection, ondansetron **OR** ondansetron (ZOFRAN) IV, ondansetron (ZOFRAN) IV, sodium chloride flush, sodium chloride flush, traMADol   Vital Signs   Vitals:   03/21/19 0500 03/21/19 0600 03/21/19 0700 03/21/19 0800  BP: (!) 160/82 (!) 180/97 (!) 153/89 (!) 164/92  Pulse: 78 88 77 81  Resp: 18 (!) 21 18 (!) 23  Temp:      TempSrc:      SpO2: 97% 97% 97% 97%  Weight: 131.6 kg     Height:        Intake/Output Summary (Last 24 hours) at 03/21/2019 0906 Last data filed at 03/21/2019 0800 Gross per 24 hour  Intake 416.71 ml  Output 3165 ml  Net -2748.29 ml   Last 3 Weights 03/21/2019 03/20/2019 03/20/2019  Weight (lbs) 290 lb  2 oz 295 lb 296 lb 11.8 oz  Weight (kg) 131.6 kg 133.811 kg 134.6 kg      Telemetry  Overnight telemetry shows normal sinus rhythm with heart rate in the 78 range, which I personally reviewed.   ECG  The most recent ECG shows normal sinus rhythm heart rate 86, right bundle branch block, which I personally reviewed.   Physical Exam   Vitals:   03/21/19 0500 03/21/19 0600 03/21/19 0700 03/21/19 0800  BP: (!) 160/82 (!) 180/97 (!) 153/89 (!) 164/92  Pulse: 78 88 77 81  Resp: 18 (!) 21 18 (!) 23  Temp:      TempSrc:      SpO2: 97% 97% 97% 97%  Weight: 131.6 kg     Height:         Intake/Output Summary (Last 24 hours) at 03/21/2019 0906 Last data filed at 03/21/2019 0800 Gross per 24 hour  Intake 416.71 ml  Output 3165 ml  Net -2748.29 ml    Last 3 Weights 03/21/2019 03/20/2019 03/20/2019  Weight (lbs) 290 lb 2 oz 295 lb 296 lb 11.8 oz  Weight (kg) 131.6 kg 133.811 kg 134.6 kg    Body mass index is 35.32 kg/m.  General: Well nourished, well developed, in no  acute distress Head: Atraumatic, normal size  Eyes: PEERLA, EOMI  Neck: Supple, no JVD Endocrine: No thryomegaly Cardiac: Normal S1, S2; RRR; no murmurs, rubs, or gallops Lungs: Diminished breath sounds bilaterally Abd: Soft, nontender, no hepatomegaly  Ext: No edema, pulses 2+ Musculoskeletal: No deformities, BUE and BLE strength normal and equal Skin: Warm and dry, no rashes   Neuro: Alert and oriented to person, place, time, and situation, CNII-XII grossly intact, no focal deficits  Psych: Normal mood and affect   Labs  High Sensitivity Troponin:   Recent Labs  Lab 03/17/19 1039 03/17/19 1239  TROPONINIHS 3 3     Cardiac EnzymesNo results for input(s): TROPONINI in the last 168 hours. No results for input(s): TROPIPOC in the last 168 hours.  Chemistry Recent Labs  Lab 03/19/19 1408 03/20/19 0506 03/21/19 0401  NA 137 138 137  K 3.4* 3.6 3.6  CL 101 105 102  CO2 '26 28 26  '$ GLUCOSE 158* 122* 124*  BUN '16  23 21  '$ CREATININE 1.01 1.32* 1.05  CALCIUM 7.6* 8.1* 8.2*  PROT 6.3* 6.4* 6.7  ALBUMIN 2.7* 2.6* 2.8*  AST '24 22 21  '$ ALT 38 36 35  ALKPHOS 66 69 79  BILITOT 1.2 0.6 0.7  GFRNONAA >60 58* >60  GFRAA >60 >60 >60  ANIONGAP '10 5 9    '$ Hematology Recent Labs  Lab 03/19/19 0616 03/20/19 0506 03/21/19 0401  WBC 5.6 4.4 4.0  RBC 3.62* 3.71* 4.07*  HGB 9.8* 9.9* 10.9*  HCT 29.5* 31.3* 33.5*  MCV 81.5 84.4 82.3  MCH 27.1 26.7 26.8  MCHC 33.2 31.6 32.5  RDW 15.9* 16.0* 15.9*  PLT 267 282 302   BNP Recent Labs  Lab 03/18/19 0716  BNP 13.8    DDimer No results for input(s): DDIMER in the last 168 hours.   Radiology  Dg Chest Port 1 View  Result Date: 03/21/2019 CLINICAL DATA:  Recent chest tube removal. Status post pericardial window for pericardial effusion. EXAM: PORTABLE CHEST 1 VIEW COMPARISON:  03/20/2019 FINDINGS: Bilateral chest tubes and right jugular central venous catheter remain in in place. No pneumothorax visualized. Small left pleural effusion and left lower lobe atelectasis or infiltrates show no significant change. There is mild atelectasis at the right lung base, also stable. Heart size is unchanged. IMPRESSION: No significant change in small left pleural effusion and left lower lobe atelectasis or infiltrates. Stable mild right basilar atelectasis. Electronically Signed   By: Marlaine Hind M.D.   On: 03/21/2019 08:40   Dg Chest Port 1 View  Result Date: 03/20/2019 CLINICAL DATA:  Shortness of breath, pleural effusion. EXAM: PORTABLE CHEST 1 VIEW COMPARISON:  Radiograph of March 19, 2019. FINDINGS: Stable cardiomegaly. Right internal jugular catheter is unchanged in position. Right-sided chest tube is noted. No pneumothorax is noted. Minimal right basilar subsegmental atelectasis is noted. Stable left pleural effusion is noted with associated atelectasis or infiltrate. Probable pericardial effusion is noted. Bony thorax is unremarkable. IMPRESSION: Stable left  pleural effusion is noted with associated atelectasis or infiltrate. Stable position of right-sided chest tube without pneumothorax. Minimal right basilar subsegmental atelectasis. Electronically Signed   By: Marijo Conception M.D.   On: 03/20/2019 08:23    Cardiac Studies  TTE 9/30  1. Left ventricular ejection fraction, by visual estimation, is 60 to 65%. The left ventricle has normal function. Normal left ventricular size. There is mildly increased left ventricular hypertrophy. Normal wall motion. D-shaped interventricular septum  suggests RV pressure/volume overload.  2.  Global right ventricle has moderately reduced systolic function.The right ventricular size is moderately enlarged. No increase in right ventricular wall thickness.  3. The left atrium was mildly dilated.  4. Right atrial size was normal.  5. The mitral valve is normal in structure. Trace mitral valve regurgitation. No evidence of mitral stenosis.  6. The tricuspid valve is normal in structure. Tricuspid valve regurgitation is trivial.  7. The aortic valve is tricuspid Aortic valve regurgitation was not visualized by color flow Doppler. Structurally normal aortic valve, with no evidence of sclerosis or stenosis.  8. The pulmonic valve was not well visualized. Pulmonic valve regurgitation was not assessed by color flow Doppler.  9. The inferior vena cava is normal in size with greater than 50% respiratory variability, suggesting right atrial pressure of 3 mmHg. 10. The tricuspid regurgitant velocity is 2.20 m/s, and with an assumed right atrial pressure of 3 mmHg, the estimated right ventricular systolic pressure is normal at 22.4 mmHg. 11. Pleural effusion noted. There is a trivial pericardial effusion present.  Patient Profile  Steven Higginsis a 63 y.o.malewith medical history significant for diabetes, hypertension, pulmonary embolism diagnosed in January 2020.He was admitted on 9/29 with tachycardia and found to have a  large pericardial effusion and findings consistent with pericardial tamponade.  Assessment & Plan   1.Pericardial effusion with tamponade -He is status post drainage of pericardial effusion with pericardial window on 9/29 with CT surgery. We were unable to perform a pericardiocentesis due to difficult windows percutaneously.  Bloody fluid removed per CT surgery. - CRP ~3, ESR 35, mildly elevated but no symptoms of pericarditis prior to this. EKG without features of pericarditis. TSH Normal and ANA normal. Culture from pericardial fluid shows WBCs/PMN but culture negative so far.  - pending labs: gram stain, cytology, protein, pathology evaluation - INR was 2.6 and only real abnormality on labs to explain this; hemorrhagic? -Chest tube was set to bulb suction yesterday, and hopefully this will be removed today - really no strong evidence to suggest this is inflammatory, or infectious; above work-up pending -Repeat echocardiogram on 9/30 shows resolution of effusion, he does have moderately reduced RV function and appears dilated -Still has left pleural effusion with possible interstitial edema -We will give him 1 more round of IV Lasix today 20 mg  2.  Left pleural effusion -Procedure related, CT surgery will determine if this needs to be drained, I suspect it will not  3. Atrial fibrillation, post operative, CHADSVASC = 2, <48 hour duration  - 2/2 to procedure/effusion  -He presented in sinus rhythm on this admission, and developed this postoperatively around 10 PM to 11 PM on 9/29 -He underwent successful cardioversion on 10/1 around 1 PM (A. fib duration was less than 48 hours) -His CHADSVASC is 2 and I do favor a short course of anticoagulation -He was started on Eliquis yesterday prior to his cardioversion and has continued today.  There is minimal output from his chest tube.  I think he will be okay to tolerate this -We have transition to a oral load of amiodarone (400 mg BID x 7  days) and then 200 mg daily for 3 weeks (1 month total of therapy and stop, this is not a long-term medication)  -We will remove his central lines and when his chest tube comes out he can go to the floor  4.  Dilated right ventricle with moderately reduced function -I reviewed his pulmonary embolism study from January, and he was noted to have  evidence of RV overload at that time.  He also has sleep apnea and uses CPAP at home -I suspect this is just the result of his prior pulmonary embolism, and OSA -RVSP was around 23 mmHg on his recent echocardiogram and his IVC is collapsible  5. HTN -I have restarted his home Norvasc 10 mg daily -Due to 1 more day of IV diuresis, I will continue to hold his ACE inhibitor as well as HCTZ -I will start hydralazine 50 mg 3 times daily -He will need to be restarted on the ACE inhibitor and HCTZ after effective diuresis  6. DM - A1c 6.6  - SSI - metformin at discharge   7. 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  After this he will transition to the progressive care unit under the cardiology service.  The plan moving forward will be to have a one-week hospital follow-up appointment in our APP clinic, and then to see me in 1 to 2 months post discharge.    For questions or updates, please contact [place] Please consult [email or web address] for contact info under        Signed, [name] T. Audie Box, [place]  03/21/2019 9:06 AM

## 2019-03-21 NOTE — Progress Notes (Signed)
Ginger with SWOT to transfer pt to 3E17 in approx 20 minutes.  Okay with charge RN to wait.

## 2019-03-21 NOTE — Progress Notes (Signed)
Phone report given to Beverlee Nims, RN on 3E.  All questions answered.  Pt to be transported to 3E17 shortly.

## 2019-03-21 NOTE — Progress Notes (Signed)
Physical Therapy Treatment Patient Details Name: Steven Tapia MRN: [id number] DOB: 1956-04-03 Today's Date: 03/21/2019    History of Present Illness Pt is a 63 year old man with hx of DM, HTN and PE admitted on 03/18/19 with tachycardia and large pericardial effusion with findings consistent with pericardial tamponade. Underwent pericardial window on 9/29.,    PT Comments    Pt admitted with above diagnosis. Pt was able to ambulate on unit with RW with good safety with the RW. VSS.  Progressing well.   Pt currently with functional limitations due to the deficits listed below (see PT Problem List). Pt will benefit from skilled PT to increase their independence and safety with mobility to allow discharge to the venue listed below.     Follow Up Recommendations  No PT follow up     Equipment Recommendations  Rolling walker with 5" wheels(tall equipment)    Recommendations for Other Services       Precautions / Restrictions Restrictions Weight Bearing Restrictions: No    Mobility  Bed Mobility Overal bed mobility: Independent             General bed mobility comments: no difficulty  Transfers Overall transfer level: Needs assistance Equipment used: None Transfers: Sit to/from Stand;Stand Pivot Transfers Sit to Stand: Supervision Stand pivot transfers: Supervision       General transfer comment: assist for lines/safety  Ambulation/Gait Ambulation/Gait assistance: Supervision Gait Distance (Feet): 150 Feet Assistive device: Rolling walker (2 wheeled) Gait Pattern/deviations: Step-through pattern;Decreased stride length   Gait velocity interpretation: 1.31 - 2.62 ft/sec, indicative of limited community ambulator General Gait Details: assist for safety. Relies on RW for support   Stairs             Wheelchair Mobility    Modified Rankin (Stroke Patients Only)       Balance Overall balance assessment: Mild deficits observed, not formally tested                                          Cognition Arousal/Alertness: Awake/alert Behavior During Therapy: WFL for tasks assessed/performed Overall Cognitive Status: Within Functional Limits for tasks assessed                                        Exercises      General Comments        Pertinent Vitals/Pain Pain Assessment: No/denies pain    Home Living                      Prior Function            PT Goals (current goals can now be found in the care plan section) Acute Rehab PT Goals Patient Stated Goal: to return to independent Progress towards PT goals: Progressing toward goals    Frequency    Min 3X/week      PT Plan Equipment recommendations need to be updated    Co-evaluation              AM-PAC PT "6 Clicks" Mobility   Outcome Measure  Help needed turning from your back to your side while in a flat bed without using bedrails?: None Help needed moving from lying on your back to sitting on the side of a flat bed  without using bedrails?: None Help needed moving to and from a bed to a chair (including a wheelchair)?: None Help needed standing up from a chair using your arms (e.g., wheelchair or bedside chair)?: A Little Help needed to walk in hospital room?: A Little Help needed climbing 3-5 steps with a railing? : A Little 6 Click Score: 21    End of Session Equipment Utilized During Treatment: Gait belt Activity Tolerance: Patient tolerated treatment well Patient left: with call bell/phone within reach;in bed;with family/visitor present Nurse Communication: Mobility status PT Visit Diagnosis: Muscle weakness (generalized) (M62.81)     Time: 3151-7616 PT Time Calculation (min) (ACUTE ONLY): 12 min  Charges:  $Gait Training: 8-22 mins                     Harmonie Verrastro,PT Acute Rehabilitation Services Pager:  [phone]  Office:  [phone]     Berline Lopes 03/21/2019, 1:17 PM

## 2019-03-22 ENCOUNTER — Encounter (HOSPITAL_COMMUNITY): Payer: Self-pay | Admitting: Nurse Practitioner

## 2019-03-22 ENCOUNTER — Inpatient Hospital Stay (HOSPITAL_COMMUNITY): Payer: BC Managed Care – PPO

## 2019-03-22 ENCOUNTER — Other Ambulatory Visit: Payer: Self-pay | Admitting: Nurse Practitioner

## 2019-03-22 DIAGNOSIS — I313 Pericardial effusion (noninflammatory): Secondary | ICD-10-CM

## 2019-03-22 DIAGNOSIS — I314 Cardiac tamponade: Secondary | ICD-10-CM

## 2019-03-22 DIAGNOSIS — I3139 Other pericardial effusion (noninflammatory): Secondary | ICD-10-CM

## 2019-03-22 LAB — CBC
HCT: 34.9 % — ABNORMAL LOW (ref 39.0–52.0)
Hemoglobin: 11.3 g/dL — ABNORMAL LOW (ref 13.0–17.0)
MCH: 26.4 pg (ref 26.0–34.0)
MCHC: 32.4 g/dL (ref 30.0–36.0)
MCV: 81.5 fL (ref 80.0–100.0)
Platelets: 304 10*3/uL (ref 150–400)
RBC: 4.28 MIL/uL (ref 4.22–5.81)
RDW: 15.9 % — ABNORMAL HIGH (ref 11.5–15.5)
WBC: 3.6 10*3/uL — ABNORMAL LOW (ref 4.0–10.5)
nRBC: 0 % (ref 0.0–0.2)

## 2019-03-22 LAB — COMPREHENSIVE METABOLIC PANEL
ALT: 31 U/L (ref 0–44)
AST: 21 U/L (ref 15–41)
Albumin: 2.7 g/dL — ABNORMAL LOW (ref 3.5–5.0)
Alkaline Phosphatase: 78 U/L (ref 38–126)
Anion gap: 10 (ref 5–15)
BUN: 19 mg/dL (ref 8–23)
CO2: 24 mmol/L (ref 22–32)
Calcium: 8.4 mg/dL — ABNORMAL LOW (ref 8.9–10.3)
Chloride: 102 mmol/L (ref 98–111)
Creatinine, Ser: 1.09 mg/dL (ref 0.61–1.24)
GFR calc Af Amer: >60 mL/min (ref >60)
GFR calc non Af Amer: >60 mL/min (ref >60)
Glucose, Bld: 118 mg/dL — ABNORMAL HIGH (ref 70–99)
Potassium: 3.5 mmol/L (ref 3.5–5.1)
Sodium: 136 mmol/L (ref 135–145)
Total Bilirubin: 0.8 mg/dL (ref 0.3–1.2)
Total Protein: 6.5 g/dL (ref 6.5–8.1)

## 2019-03-22 LAB — BODY FLUID CULTURE: Culture: NO GROWTH

## 2019-03-22 MED ORDER — FUROSEMIDE 40 MG PO TABS: 40.0000 mg | ORAL_TABLET | Freq: Every day | ORAL | 6 refills | Status: DC

## 2019-03-22 MED ORDER — HYDRALAZINE HCL 25 MG PO TABS: 25.0000 mg | ORAL_TABLET | Freq: Three times a day (TID) | ORAL | 6 refills | Status: DC

## 2019-03-22 MED ORDER — AMIODARONE HCL 200 MG PO TABS: 200.0000 mg | ORAL_TABLET | Freq: Every day | ORAL | 6 refills | Status: DC

## 2019-03-22 MED ORDER — POTASSIUM CHLORIDE ER 20 MEQ PO TBCR: 20.0000 meq | EXTENDED_RELEASE_TABLET | Freq: Every day | ORAL | 6 refills | Status: DC

## 2019-03-22 MED ORDER — AMIODARONE HCL 400 MG PO TABS: 400.0000 mg | ORAL_TABLET | Freq: Two times a day (BID) | ORAL | 0 refills | Status: DC

## 2019-03-22 MED ORDER — APIXABAN 5 MG PO TABS: 5.0000 mg | ORAL_TABLET | Freq: Two times a day (BID) | ORAL | 6 refills | Status: DC

## 2019-03-22 NOTE — Plan of Care (Signed)
  Problem: Education: Goal: Knowledge of General Education information will improve Description: Including pain rating scale, medication(s)/side effects and non-pharmacologic comfort measures Outcome: Progressing   Problem: Health Behavior/Discharge Planning: Goal: Ability to manage health-related needs will improve Outcome: Progressing   Problem: Clinical Measurements: Goal: Ability to maintain clinical measurements within normal limits will improve Outcome: Adequate for Discharge Goal: Will remain free from infection Outcome: Adequate for Discharge Goal: Diagnostic test results will improve Outcome: Adequate for Discharge Goal: Respiratory complications will improve Outcome: Adequate for Discharge Goal: Cardiovascular complication will be avoided Outcome: Adequate for Discharge   Problem: Activity: Goal: Risk for activity intolerance will decrease Outcome: Adequate for Discharge   Problem: Nutrition: Goal: Adequate nutrition will be maintained Outcome: Adequate for Discharge   Problem: Coping: Goal: Level of anxiety will decrease Outcome: Adequate for Discharge   Problem: Elimination: Goal: Will not experience complications related to bowel motility Outcome: Adequate for Discharge Goal: Will not experience complications related to urinary retention Outcome: Adequate for Discharge   Problem: Pain Managment: Goal: General experience of comfort will improve Outcome: Adequate for Discharge   Problem: Safety: Goal: Ability to remain free from injury will improve Outcome: Adequate for Discharge   Problem: Skin Integrity: Goal: Risk for impaired skin integrity will decrease Outcome: Adequate for Discharge

## 2019-03-22 NOTE — Progress Notes (Signed)
2 Days Post-Op Procedure(s) (LRB): CARDIOVERSION (N/A) Subjective: Says he feel well and has no shortness of breath. Minimal pain.   Objective: Vital signs in last 24 hours: Temp:  [98.1 F (36.7 C)-98.7 F (37.1 C)] 98.6 F (37 C) (10/03 0603) Pulse Rate:  [86-92] 87 (10/03 0603) Cardiac Rhythm: Normal sinus rhythm (10/03 0700) Resp:  [18-26] 18 (10/03 0603) BP: (129-138)/(59-83) 137/83 (10/03 0603) SpO2:  [94 %-96 %] 94 % (10/03 0603) Weight:  [128.5 kg] 128.5 kg (10/03 0603)    Intake/Output from previous day: 10/02 0701 - 10/03 0700 In: 543 [P.O.:540; I.V.:3] Out: 2780 [Urine:2775; Chest Tube:5] Intake/Output this shift: Total I/O In: 240 [P.O.:240] Out: -   General appearance: alert, cooperative and no distress Neurologic: intact Wound: Right CT exit site is covered with a dry, intact dressing.   Lab Results: Recent Labs    03/21/19 1352 03/22/19 0426  WBC 4.5 3.6*  HGB 11.6* 11.3*  HCT 36.6* 34.9*  PLT 338 304   BMET:  Recent Labs    03/21/19 0401 03/22/19 0426  NA 137 136  K 3.6 3.5  CL 102 102  CO2 26 24  GLUCOSE 124* 118*  BUN 21 19  CREATININE 1.05 1.09  CALCIUM 8.2* 8.4*    PT/INR:  Recent Labs    03/20/19 0506  LABPROT 18.3*  INR 1.5*   ABG No results found for: PHART, HCO3, TCO2, ACIDBASEDEF, O2SAT CBG (last 3)  Recent Labs    03/21/19 1152  GLUCAP 113*    Assessment/Plan: S/P Procedure(s) (LRB): CARDIOVERSION (N/A)  -POD-4 right VATS for drainage of pericardial effusion. Fluid cultures negative.  Will recheck CXR since CT removed yesterday.  Please call if we may assist further.    LOS: 4 days    Antony Odea, PA-C [phone] 03/22/2019

## 2019-03-22 NOTE — Discharge Summary (Addendum)
Discharge Summary    Patient ID: Steven Tapia,  MRN: [id number], DOB/AGE: [birth year] 63 y.o.  Admit date: 03/17/2019 Discharge date: 03/22/2019  Primary Care Provider: Levin Bacon Primary Cardiologist: Evalina Field, MD  Discharge Diagnoses    Principal Problem:   Cardiac/pericardial tamponade Active Problems:   Pericardial effusion   Paroxysmal atrial fibrillation (HCC)   HTN (hypertension)   DM2 (diabetes mellitus, type 2) (HCC)   Hyperlipidemia   On continuous oral anticoagulation   Allergies No Known Allergies  Diagnostic Studies/Procedures    2D Echocardiogram 9.29.2020  IMPRESSIONS      1. Left ventricular ejection fraction, by visual estimation, is 60 to 65%. The left ventricle has normal function. Normal left ventricular size. There is mildly increased left ventricular hypertrophy.  2. Left ventricular diastolic Doppler parameters are consistent with impaired relaxation pattern of LV diastolic filling.  3. Global right ventricle has normal systolic function.The right ventricular size is normal.  4. Right atrial size was normal.  5. Large pericardial effusion.  6. The mitral valve is normal in structure. No evidence of mitral valve regurgitation. No evidence of mitral stenosis.  7. The tricuspid valve is not well visualized. Tricuspid valve regurgitation was not visualized by color flow Doppler.  8. The aortic valve is normal in structure. Aortic valve regurgitation was not visualized by color flow Doppler. Structurally normal aortic valve, with no evidence of sclerosis or stenosis.  9. The pulmonic valve was normal in structure. Pulmonic valve regurgitation is not visualized by color flow Doppler. 10. The inferior vena cava is dilated in size with <50% respiratory variability, suggesting right atrial pressure of 15 mmHg. 11. Normal LV systolic function; grade 1 diastolic dysfunction; large pericardial effusion; respiratory variation, dilated IVC and RV  diastolic collapse noted suggesting tamponade physiology; mild LVH; _____________  Attempted pericardiocentesis 9.29.2020   Unsuccessful attempt at pericardiocentesis.   Plan surgical consult for window to drain effusion.  _____________  Procedure: - Right Video assisted thoracoscopy - Pericardial window  _____________   Limited echocardiogram 9.30.2020  IMPRESSIONS      1. Left ventricular ejection fraction, by visual estimation, is 60 to 65%. The left ventricle has normal function. Normal left ventricular size. There is mildly increased left ventricular hypertrophy. Normal wall motion. D-shaped interventricular septum  suggests RV pressure/volume overload.  2. Global right ventricle has moderately reduced systolic function.The right ventricular size is moderately enlarged. No increase in right ventricular wall thickness.  3. The left atrium was mildly dilated.  4. Right atrial size was normal.  5. The mitral valve is normal in structure. Trace mitral valve regurgitation. No evidence of mitral stenosis.  6. The tricuspid valve is normal in structure. Tricuspid valve regurgitation is trivial.  7. The aortic valve is tricuspid Aortic valve regurgitation was not visualized by color flow Doppler. Structurally normal aortic valve, with no evidence of sclerosis or stenosis.  8. The pulmonic valve was not well visualized. Pulmonic valve regurgitation was not assessed by color flow Doppler.  9. The inferior vena cava is normal in size with greater than 50% respiratory variability, suggesting right atrial pressure of 3 mmHg. 10. The tricuspid regurgitant velocity is 2.20 m/s, and with an assumed right atrial pressure of 3 mmHg, the estimated right ventricular systolic pressure is normal at 22.4 mmHg. 11. Pleural effusion noted. There is a trivial pericardial effusion present. _____________    Direct Current Cardioversion 10.1.2020  Cardioverted x 1 with 150J of biphasic synchronized  energy _____________  History of Present Illness     63 year old male with a history of diabetes, hypertension, obesity, and pulmonary embolism in 06/2018.  Following PE, he was initially treated w/ eliquis, but later he was switched over to coumadin w/ INR f/u by his PCP.  He presented to the [facility] ED on 9/28 with a 4 day history of elevated heart rates and lower than usual blood pressures - usually running in the 140's - 150's but recently trending in the 120's.  He noted mild dyspnea, but was not otherwise symptomatic.  In the ED, he was found to be in sinus tachcyardia w/ rates in the 100-120 range.  Labwork was notable only for mild anemia w/ a hemoglobin of 11.2  Hs-Troponins were normal.  INR was Rx @ 2.6. CTA of the chest was negative for PE however, a new, moderate to large pericardial effusion was present.  Pt was admitted for further evaluation.  Hospital Course     Consultants: CT Surgery   Following admission, echocardiogram on 9/29 showed a large pericardial effusion with tamponade physiology.  He was taken to the cath lab and pericardiocentesis was attempted however, this was unsuccessful.  CT surgery was consulted and he was taken to the OR, where he underwent pericardial window.  Pathology showed reacitve mesothelial cells and chronic inflammation.  Cytology was negative for malignancy.  CRP ~ 3. ESR 35. Post-operatively, he developed Afib w/ RVR and was placed on IV amiodarone. He remained hemodynamically stable.  Follow-up echo on 9/30, showed only a trivial pericardial effusion.  He remained in afib on 10/1 and eliquis was initiated. He underwent successful cardioversion  on the afternoon of 10/1.  He has been transitioned to oral amiodarone with a plan to continue '400mg'$  BID x a total of 7 days and then '200mg'$  daily (to begin 03/27/2019).  We are hopeful to limit amiodarone course to 3 wks.  He has not had any recurrent afib.  Chest tubes were removed on 10/2 and he has been  ambulating without difficulty.  In the setting of elevated right sided filling pressures on echo, diuresis was initiated.  He has been minus 5L over the past three days with weight reduction from 134.3 kg on admission, down to 128.5kg this AM.   F/u chest x ray this AM did show slight hazy bilateral/bibasilar opacification, likely edema.  In that setting, he Garrett Mitchum be discharge on low dose oral diuretic.  He has f/u with CT surgery in ~ 1 wk and Lindsie Simar also arrange for f/u limited echo and general cardiology follow-up within the next two weeks.  He is being discharged home today in good condition. _____________ Physical Exam   Discharge Vitals Blood pressure 140/76, pulse 100, temperature 98.8 F (37.1 C), temperature source Oral, resp. rate 20, height '6\' 4"'$  (1.93 m), weight 128.5 kg, SpO2 98 %.  Filed Weights   03/20/19 1243 03/21/19 0500 03/22/19 0603  Weight: 133.8 kg 131.6 kg 128.5 kg    GEN: Well nourished, well developed, in no acute distress.  HEENT: Grossly normal.  Neck: Obese, supple, difficult to gauge JVP. No carotid bruits, or masses. Cardiac: RRR, no murmurs, rubs, or gallops. No clubbing, cyanosis, edema.  Radials/DP/PT 2+ and equal bilaterally.  Respiratory:  Respirations regular and unlabored, clear to auscultation bilaterally. GI: Soft, nontender, nondistended, BS + x 4. MS: no deformity or atrophy. Skin: warm and dry, no rash. Neuro:  Strength and sensation are intact. Psych: AAOx3.  Normal affect.  Labs & Radiologic Studies  CBC Recent Labs    03/21/19 1352 03/22/19 0426  WBC 4.5 3.6*  HGB 11.6* 11.3*  HCT 36.6* 34.9*  MCV 82.1 81.5  PLT 338 250   Basic Metabolic Panel Recent Labs    03/21/19 0401 03/22/19 0426  NA 137 136  K 3.6 3.5  CL 102 102  CO2 26 24  GLUCOSE 124* 118*  BUN 21 19  CREATININE 1.05 1.09  CALCIUM 8.2* 8.4*   Liver Function Tests Recent Labs    03/21/19 0401 03/22/19 0426  AST 21 21  ALT 35 31  ALKPHOS 79 78  BILITOT 0.7  0.8  PROT 6.7 6.5  ALBUMIN 2.8* 2.7*   Cardiac Enzymes Recent Labs  Lab 03/17/19 1039 03/17/19 1239  TROPONINIHS 3 3      Thyroid Function Tests Lab Results  Component Value Date   TSH 0.466 03/17/2019   _____________  Dg Chest 2 View  Result Date: 03/17/2019 CLINICAL DATA:  Tachycardia, hypertension EXAM: CHEST - 2 VIEW COMPARISON:  06/24/2018 FINDINGS: Mild cardiomegaly, stable. Mild pulmonary vascular congestion. Patchy bibasilar opacities, left worse than right. No pleural effusion. No pneumothorax. IMPRESSION: 1. Cardiomegaly with mild pulmonary vascular congestion. 2. Bibasilar opacities, left greater than right, which may reflect atelectasis and/or pneumonia in the appropriate clinical setting. Electronically Signed   By: Davina Poke M.D.   On: 03/17/2019 12:29   Ct Angio Chest Pe W And/or Wo Contrast  Result Date: 03/17/2019 CLINICAL DATA:  Elevated blood pressure. History of diabetes and pulmonary embolism. EXAM: CT ANGIOGRAPHY CHEST WITH CONTRAST TECHNIQUE: Multidetector CT imaging of the chest was performed using the standard protocol during bolus administration of intravenous contrast. Multiplanar CT image reconstructions and MIPs were obtained to evaluate the vascular anatomy. CONTRAST:  163m OMNIPAQUE IOHEXOL 350 MG/ML SOLN COMPARISON:  Prior CT 06/24/2018. FINDINGS: Cardiovascular: The pulmonary arteries are well opacified with contrast to the level of the subsegmental branches. There is no evidence of acute pulmonary embolism. The aorta and great vessels demonstrate no significant findings. There is a new moderate to large pericardial effusion. The heart size is normal. There is no significant reflux of contrasted blood into the IVC or hepatic veins. Mediastinum/Nodes: There are no enlarged mediastinal, hilar or axillary lymph nodes.Stable nodularity of the left thyroid lobe. The trachea and esophagus appear normal. Lungs/Pleura: New small left-greater-than-right pleural  effusions with associated dependent atelectasis at both lung bases. No confluent airspace opacity, endobronchial lesion or suspicious pulmonary nodule. Upper abdomen:  The visualized upper abdomen appears unremarkable. Musculoskeletal/Chest wall: There is no chest wall mass or suspicious osseous finding. Review of the MIP images confirms the above findings. IMPRESSION: 1. No evidence of acute pulmonary embolism. 2. New moderate to large pericardial effusion without CT evidence of tamponade. This could be symptomatic. Consider further evaluation with echocardiography. 3. New small bilateral pleural effusions with associated bibasilar atelectasis. 4. Stable nodularity of the left thyroid lobe, previously evaluated by ultrasound. 5. These results were called by telephone at the time of interpretation on 03/17/2019 at 2:14 pm to provider ADAM CURATOLO , who verbally acknowledged these results. Electronically Signed   By: WRichardean SaleM.D.   On: 03/17/2019 14:15   Dg Chest Port 1 View  Result Date: 03/22/2019 CLINICAL DATA:  Hypertension, evaluate cardiopulmonary status. EXAM: PORTABLE CHEST 1 VIEW COMPARISON:  03/21/2019 FINDINGS: Lungs are hypoinflated demonstrate slight interval worsening hazy bilateral perihilar/bibasilar opacification likely mild interstitial edema. Stable small left effusion likely with associated basilar atelectasis. Cardiomediastinal silhouette and remainder of  the exam is unchanged. IMPRESSION: Slight interval worsening hazy bilateral perihilar/bibasilar opacification likely edema. Stable small left effusion likely with associated basilar atelectasis. Electronically Signed   By: Marin Olp M.D.   On: 03/22/2019 12:18   Dg Chest Port 1 View  Result Date: 03/21/2019 CLINICAL DATA:  Recent chest tube removal. Status post pericardial window for pericardial effusion. EXAM: PORTABLE CHEST 1 VIEW COMPARISON:  03/20/2019 FINDINGS: Bilateral chest tubes and right jugular central venous  catheter remain in in place. No pneumothorax visualized. Small left pleural effusion and left lower lobe atelectasis or infiltrates show no significant change. There is mild atelectasis at the right lung base, also stable. Heart size is unchanged. IMPRESSION: No significant change in small left pleural effusion and left lower lobe atelectasis or infiltrates. Stable mild right basilar atelectasis. Electronically Signed   By: Marlaine Hind M.D.   On: 03/21/2019 08:40   Dg Chest Port 1 View  Result Date: 03/20/2019 CLINICAL DATA:  Shortness of breath, pleural effusion. EXAM: PORTABLE CHEST 1 VIEW COMPARISON:  Radiograph of March 19, 2019. FINDINGS: Stable cardiomegaly. Right internal jugular catheter is unchanged in position. Right-sided chest tube is noted. No pneumothorax is noted. Minimal right basilar subsegmental atelectasis is noted. Stable left pleural effusion is noted with associated atelectasis or infiltrate. Probable pericardial effusion is noted. Bony thorax is unremarkable. IMPRESSION: Stable left pleural effusion is noted with associated atelectasis or infiltrate. Stable position of right-sided chest tube without pneumothorax. Minimal right basilar subsegmental atelectasis. Electronically Signed   By: Marijo Conception M.D.   On: 03/20/2019 08:23   Dg Chest Port 1 View  Result Date: 03/19/2019 CLINICAL DATA:  Chest tube, pneumothorax EXAM: PORTABLE CHEST 1 VIEW COMPARISON:  Portable exam 0540 hours compared to 03/18/2019 FINDINGS: RIGHT chest tube unchanged. Additional drainage tube projects over the cardiac silhouette question pericardial. RIGHT jugular central venous catheter with tip projecting over SVC near cavoatrial junction. Enlargement of cardiac silhouette with pulmonary vascular congestion. Increased opacity at the LEFT lung base likely representing a combination of pleural effusion with increased atelectasis versus consolidation in the LEFT lower lobe and lingula. Minimal atelectasis  at RIGHT base. No pneumothorax. Osseous structures stable. IMPRESSION: LEFT pleural effusion with increased atelectasis versus consolidation at LEFT lung base. Mild RIGHT basilar atelectasis. RIGHT chest tube and question pericardial drain without pneumothorax. Electronically Signed   By: Lavonia Dana M.D.   On: 03/19/2019 08:18   Dg Chest Port 1 View  Result Date: 03/18/2019 CLINICAL DATA:  Encounter for central line placement. Pneumothorax. EXAM: PORTABLE CHEST 1 VIEW COMPARISON:  Radiograph and CT yesterday FINDINGS: Right internal jugular central venous catheter tip in the mid SVC. No visualized pneumothorax. Presumed mediastinal drain projects over the central chest. Similar enlargement of the cardiac/pericardiac silhouette. Overall lower lung volumes with bronchovascular crowding. Left pleural effusion. IMPRESSION: 1. Right internal jugular central venous catheter tip in the mid SVC. No pneumothorax. 2. Lower lung volumes with bronchovascular crowding. Left pleural effusion. 3. Presumed mediastinal drain in place. Unchanged cardiomegaly/enlarged pericardial silhouette, pericardial effusion on CT. Electronically Signed   By: Keith Rake M.D.   On: 03/18/2019 22:13   Disposition   Pt is being discharged home today in good condition.  Follow-up Plans & Appointments   Follow-up Information    Lajuana Matte, MD. Go on 03/28/2019.   Specialty: Thoracic Surgery Why: Appointment time is at 1:00 pm Contact information: [address] [place] [address] [phone]  Geralynn Rile, MD Follow up.   Specialties: Internal Medicine, Cardiology Why: we Andre Gallego contact you for follow-up appt in ~ 2 wks. Contact information: [address] [place] [address] [phone]          Discharge Instructions    Call MD for:  difficulty breathing, headache or visual disturbances   Complete by: As directed    Call MD for:  persistant nausea and vomiting    Complete by: As directed    Call MD for:  redness, tenderness, or signs of infection (pain, swelling, redness, odor or green/yellow discharge around incision site)   Complete by: As directed    Call MD for:  severe uncontrolled pain   Complete by: As directed    Call MD for:  temperature >100.4   Complete by: As directed    Diet - low sodium heart healthy   Complete by: As directed    Increase activity slowly   Complete by: As directed       Discharge Medications   Allergies as of 03/22/2019   No Known Allergies     Medication List    STOP taking these medications   hydrochlorothiazide 25 MG tablet Commonly known as: HYDRODIURIL   lisinopril 40 MG tablet Commonly known as: ZESTRIL   Rivaroxaban 15 & 20 MG Tbpk   warfarin 10 MG tablet Commonly known as: COUMADIN     TAKE these medications   amiodarone 400 MG tablet Commonly known as: PACERONE Take 1 tablet (400 mg total) by mouth 2 (two) times daily.   amiodarone 200 MG tablet Commonly known as: PACERONE Take 1 tablet (200 mg total) by mouth daily. Start taking on: March 27, 2019 Notes to patient: Switch to 1 tablet daily   amLODipine 10 MG tablet Commonly known as: NORVASC Take 1 tablet (10 mg total) by mouth daily. For BP   apixaban 5 MG Tabs tablet Commonly known as: ELIQUIS Take 1 tablet (5 mg total) by mouth 2 (two) times daily.   atorvastatin 20 MG tablet Commonly known as: LIPITOR Take 20 mg by mouth daily.   furosemide 40 MG tablet Commonly known as: Lasix Take 1 tablet (40 mg total) by mouth daily.   hydrALAZINE 25 MG tablet Commonly known as: APRESOLINE Take 1 tablet (25 mg total) by mouth every 8 (eight) hours.   metFORMIN 500 MG tablet Commonly known as: GLUCOPHAGE Take 1 tablet (500 mg total) by mouth 2 (two) times daily with a meal. Start on 06/28/2018   Potassium Chloride ER 20 MEQ Tbcr Take 20 mEq by mouth daily.         Outstanding Labs/Studies   Follow-up limited echo and  BMET in 7-10 days.  Duration of Discharge Encounter   Greater than 30 minutes including physician time.  Signed, Murray Hodgkins NP 03/22/2019, 12:59 PM    I have seen and examined this patient with Ignacia Bayley.  Agree with above, note added to reflect my findings.  On exam, RRR, no murmurs, lungs clear.  She presented to hospital with a pericardial effusion requiring pericardial window.  His course was complicated by atrial fibrillation and is now on amiodarone and Eliquis.  We Jadia Capers plan for discharge with follow-up in cardiology clinic for further discussion of pericardial effusion and atrial fibrillation.  Verna Hamon M. Triana Coover MD 03/22/2019 1:11 PM

## 2019-03-22 NOTE — TOC Progression Note (Addendum)
Transition of Care [facility]) - Progression Note    Patient Details  Name: Steven Tapia MRN: [id number] Date of Birth: August 04, 1955  [facility] CM/SW Contact  Zenon Mayo, RN Phone Number: 03/22/2019, 12:07 PM  Clinical Narrative:    Patient states he has insurance with [facility], id [id number], RX bin [phone], rx pcn WG.  NCM gave patient the eliquis 10.00 copay card, he has already used the 30 day free.          Expected Discharge Plan and Services           Expected Discharge Date: 03/22/19                                     Social Determinants of Health (SDOH) Interventions    Readmission Risk Interventions No flowsheet data found.

## 2019-03-24 ENCOUNTER — Telehealth: Payer: Self-pay | Admitting: Cardiovascular Disease

## 2019-03-24 NOTE — Telephone Encounter (Signed)
  LVMTCB to schedule hosptial f/u appt with Dr Audie Box

## 2019-03-25 ENCOUNTER — Telehealth: Payer: Self-pay | Admitting: Cardiovascular Disease

## 2019-03-25 NOTE — Telephone Encounter (Signed)
Fax recevied to the [place] office regarding the patient being on warfarin & amiodarone and the interaction between the 2 drugs. Also, that they had received RX's for both amiodarone 400 mg tablets and amiodarone 200 mg tablets.  I called the pharmacy back and left a message stating: 1) the patient's warfarin was stopped at discharge on 10/3 2) he was to take amiodarone 400 mg BID until 10/8, then  3) start amiodarone 200 mg once daily (per his discharge summary.  I asked that the pharmacy call back with any further questions/ concerns.

## 2019-03-27 ENCOUNTER — Encounter (INDEPENDENT_AMBULATORY_CARE_PROVIDER_SITE_OTHER): Payer: Self-pay

## 2019-03-27 ENCOUNTER — Ambulatory Visit (HOSPITAL_COMMUNITY): Payer: Self-pay | Attending: Cardiology

## 2019-03-27 ENCOUNTER — Other Ambulatory Visit: Payer: Self-pay

## 2019-03-27 ENCOUNTER — Telehealth: Payer: Self-pay | Admitting: *Deleted

## 2019-03-27 DIAGNOSIS — I3139 Other pericardial effusion (noninflammatory): Secondary | ICD-10-CM

## 2019-03-27 DIAGNOSIS — I313 Pericardial effusion (noninflammatory): Secondary | ICD-10-CM | POA: Insufficient documentation

## 2019-03-27 NOTE — Telephone Encounter (Signed)
Patient is here for appointment. Covid 19 testing in the last 14 days, per pt negative results.

## 2019-03-28 ENCOUNTER — Ambulatory Visit (INDEPENDENT_AMBULATORY_CARE_PROVIDER_SITE_OTHER): Payer: Self-pay | Admitting: Thoracic Surgery (Cardiothoracic Vascular Surgery)

## 2019-03-28 ENCOUNTER — Encounter: Payer: Self-pay | Admitting: Thoracic Surgery (Cardiothoracic Vascular Surgery)

## 2019-03-28 VITALS — BP 132/72 | HR 94 | Temp 97.8°F | Resp 20 | Ht 76.0 in | Wt 284.0 lb

## 2019-03-28 DIAGNOSIS — I313 Pericardial effusion (noninflammatory): Secondary | ICD-10-CM

## 2019-03-28 DIAGNOSIS — I3139 Other pericardial effusion (noninflammatory): Secondary | ICD-10-CM

## 2019-03-28 DIAGNOSIS — Z09 Encounter for follow-up examination after completed treatment for conditions other than malignant neoplasm: Secondary | ICD-10-CM

## 2019-03-28 DIAGNOSIS — I314 Cardiac tamponade: Secondary | ICD-10-CM

## 2019-03-28 NOTE — Progress Notes (Signed)
      [address]       [place] [address]             [phone]        Atharv Poole [place] Medical Record #[id number] Date of Birth: 23-Aug-1955  Referring: Jettie Booze, MD Primary Care: Levin Bacon, NP Primary Cardiologist:[place] Doreatha Lew, MD  Reason for visit:   follow-up  History of Present Illness:     Mr. Stamos presents for his 1st follow-up appointment after undergoing a right VATS pericardial window.  He has no complains.  He denies any chest pain  Physical Exam: BP 132/72   Pulse 94   Temp 97.8 F (36.6 C) (Skin)   Resp 20   Ht 6\' 4"  (1.93 m)   Wt 284 lb (128.8 kg)   SpO2 96% Comment: RA  BMI 34.57 kg/m   Alert NAD Incision clean.   Abdomen soft, NT/ND no peripheral edema   Diagnostic Studies & Laboratory data:  Path: negative for malignant cells    Assessment / Plan:   S/p R VATS, pericardial window.  Doing well Clear from a surgical standpoint.  Will follow-up with cardiology RTC PRN   Lajuana Matte 03/28/2019 2:02 PM

## 2019-04-07 ENCOUNTER — Telehealth: Payer: Self-pay | Admitting: *Deleted

## 2019-04-07 NOTE — Telephone Encounter (Signed)
Called - left message for patient to come to appointment 04/09/19 , do not have to return call since was unable to given result over the phone

## 2019-04-07 NOTE — Telephone Encounter (Signed)
-----   Message from Caprice Beaver, LPN sent at 02/18/3299  7:49 AM EDT ----- Patient to see you 10/21 ----- Message ----- From: Theora Gianotti, NP Sent: 03/27/2019   5:21 PM EDT To: Cv Div Nl Triage  Nl heart squeezing fxn.  Only trivial pericardial effusion - stable since discharge.

## 2019-04-08 ENCOUNTER — Telehealth: Payer: Self-pay | Admitting: Cardiovascular Disease

## 2019-04-08 NOTE — Telephone Encounter (Signed)
New Message    Patient would like wife to accompany him to appointment please call patient to discuss.

## 2019-04-08 NOTE — Telephone Encounter (Signed)
Spoke with patient, explained social distancing in the lobby was a priority and given our lobby is so small the preferred method is to call wife when Dr Audie Box goes in with patient. Patient is agreeable with that plan.

## 2019-04-08 NOTE — Progress Notes (Signed)
Cardiology Office Note:   Date:  04/09/2019  NAME:  Steven BoeckRalph Tapia    MRN: [id number] DOB:  [birth year]   PCP:  Ronney LionHazy, Marian, NP  Cardiologist:  Reatha HarpsWesley T O'Neal, MD  Electrophysiologist:  None   Referring MD: Ronney LionHazy, Marian, NP   Chief Complaint  Patient presents with  . Pericardial Effusion   History of Present Illness:   Steven Tapia is a 63 y.o. male with a hx of hypertension, DVT/PE, recent pericardial effusion with cardiac tamponade status post pericardial window who presents for follow-up of his pericardial effusion.  He was admitted to the hospital in early October and found to be in cardiac tamponade.  The etiology of his pericardial effusion was presumptively related to Coumadin therapy, all of his cytology and infectious work-up was negative.  He was rechallenged on Eliquis prior to discharge and did well with this.  His course was also complicated by postoperative atrial fibrillation and he underwent a direct-current cardioversion.  His A. fib duration was less than 48 hours and he did not undergo a TEE.  He has remained on Eliquis for prophylaxis.  He has not returned to work at this time due to our advice that it is not safe.  He does report some congestion in the right chest.  He reports no trouble breathing or chest pain.  He does report fatigue and shortness of breath with exertion.  He states he is not doing much but is wanting to get back to doing more exercise.  He was seen by CT surgery and they have released him.  He is done well from a surgical standpoint.  Blood pressure a bit elevated today.  He has remained on amiodarone as well and should stop this on the 30th.  He appears to be doing much better.  Review of laboratory data shows no evidence of malignancy or infection in the samples that were sent.  Really unclear source of this and he describes no symptoms of pericarditis.  EKG without pericarditis changes.   Past Medical History: Past Medical History:  Diagnosis Date  .  Cardiac/pericardial tamponade    a. 03/2019 s/p pericardial window-->Path: inflammation; Cytology: no malignant cells.  . Diabetes mellitus without complication (HCC)   . Hypertension   . PE (pulmonary thromboembolism) (HCC)     Past Surgical History: Past Surgical History:  Procedure Laterality Date  . ACHILLES TENDON REPAIR    . ACHILLES TENDON SURGERY    . CARDIOVERSION N/A 03/20/2019   Procedure: CARDIOVERSION;  Surgeon: Jodelle Redhristopher, Bridgette, MD;  [facility];  Service: Cardiovascular;  Laterality: N/A;  . PERICARDIOCENTESIS N/A 03/18/2019   Procedure: PERICARDIOCENTESIS;  Surgeon: Corky CraftsVaranasi, Jayadeep S, MD;  [facility];  Service: Cardiovascular;  Laterality: N/A;  . VIDEO ASSISTED THORACOSCOPY (VATS)/WEDGE RESECTION Right 03/18/2019   Procedure: RIGHT VIDEO ASSISTED THORACOSCOPY (VATS) PERICARDIAL WINDOW;  Surgeon: Corliss SkainsLightfoot, Harrell O, MD;  Location: [facility] OR;  Service: Thoracic;  Laterality: Right;    Current Medications: Current Meds  Medication Sig  . amiodarone (PACERONE) 200 MG tablet Take 1 tablet (200 mg total) by mouth daily.  Marland Kitchen. amLODipine (NORVASC) 10 MG tablet Take 1 tablet (10 mg total) by mouth daily. For BP  . apixaban (ELIQUIS) 5 MG TABS tablet Take 1 tablet (5 mg total) by mouth 2 (two) times daily.  Marland Kitchen. atorvastatin (LIPITOR) 20 MG tablet Take 20 mg by mouth daily.  . furosemide (LASIX) 40 MG tablet Take 1 tablet (40 mg total) by mouth daily.  .Marland Kitchen  metFORMIN (GLUCOPHAGE) 500 MG tablet Take 1 tablet (500 mg total) by mouth 2 (two) times daily with a meal. Start on 06/28/2018  . potassium chloride 20 MEQ TBCR Take 20 mEq by mouth daily.  . [DISCONTINUED] hydrALAZINE (APRESOLINE) 25 MG tablet Take 1 tablet (25 mg total) by mouth every 8 (eight) hours.     Allergies:    Patient has no known allergies.   Social History: Social History   Socioeconomic History  . Marital status: Married    Spouse name: Not on file  . Number of children: Not  on file  . Years of education: Not on file  . Highest education level: Not on file  Occupational History  . Not on file  Social Needs  . Financial resource strain: Not on file  . Food insecurity    Worry: Not on file    Inability: Not on file  . Transportation needs    Medical: Not on file    Non-medical: Not on file  Tobacco Use  . Smoking status: Never Smoker  . Smokeless tobacco: Never Used  Substance and Sexual Activity  . Alcohol use: Never    Frequency: Never  . Drug use: Never  . Sexual activity: Not on file  Lifestyle  . Physical activity    Days per week: Not on file    Minutes per session: Not on file  . Stress: Not on file  Relationships  . Social Musician on phone: Not on file    Gets together: Not on file    Attends religious service: Not on file    Active member of club or organization: Not on file    Attends meetings of clubs or organizations: Not on file    Relationship status: Not on file  Other Topics Concern  . Not on file  Social History Narrative  . Not on file     Family History: The patient's family history includes Alzheimer's disease in his mother; Clotting disorder in his father; Diabetes in his sister.  ROS:   All other ROS reviewed and negative. Pertinent positives noted in the HPI.     EKGs/Labs/Other Studies Reviewed:   The following studies were personally reviewed by me today:  EKG:  EKG is ordered today.  The ekg ordered today demonstrates normal sinus rhythm, heart rate 94, right bundle branch block noted, T wave inversions noted in the lateral leads, and was personally reviewed by me.   Pericardial biopsy:  A. PERICARDIUM, BIOPSY:  - Pericardium with reactive mesothelial cells and chronic inflammation.  - No malignancy identified.   COMMENT:   Immunohistochemistry highlights reactive mesothelial cells  (pancytokeratin, calretinin, WT-1 positive). TTF-1 and MOC31 are  negative.   Pericardial Fluid analysis:   Clinical History: Pericardial fluid  Specimen Submitted: A. PERICARDIUM, PERICARDIOCENTESS    DIAGNOSIS:  - No malignant cells identified   SPECIMEN ADEQUACY:  Satisfactory for evaluation   GROSS:  Received is/are 40 cc's of bloody fluid.  Smears: 0  Concentration Method (ThinPrep): 1  Cell Block: Cell bliock attempted but not obtained  Additional Studies:   Specimen Description FLUID PERICARDIAL   Special Requests PATIENT ON FOLLOWING ANCEF 3G   Gram Stain ABUNDANT WBC PRESENT,BOTH PMN AND MONONUCLEAR  NO ORGANISMS SEEN   Culture NO GROWTH 3 DAYS  Performed at [facility], [address], [place], [place] [address]   Report Status 03/22/2019 FINAL       HIV negative   Covid  19 negative    Recent Labs: 03/17/2019: TSH 0.466 03/18/2019: B Natriuretic Peptide 13.8 03/22/2019: ALT 31; BUN 19; Creatinine, Ser 1.09; Hemoglobin 11.3; Platelets 304; Potassium 3.5; Sodium 136   Recent Lipid Panel No results found for: CHOL, TRIG, HDL, CHOLHDL, VLDL, LDLCALC, LDLDIRECT  Physical Exam:   VS:  BP (!) 154/84   Pulse (!) 105   Temp (!) 96.8 F (36 C)   Ht  (1.93 m)   Wt 281 lb 9.6 oz (127.7 kg)   SpO2 98%   BMI 34.28 kg/m    Wt Readings from Last 3 Encounters:  04/09/19 281 lb 9.6 oz (127.7 kg)  03/28/19 284 lb (128.8 kg)  03/22/19 283 lb 6.4 oz (128.5 kg)    General: Well nourished, well developed, in no acute distress Heart: Atraumatic, normal size  Eyes: PEERLA, EOMI  Neck: Supple, no JVD Endocrine: No thryomegaly Cardiac: Normal S1, S2; RRR; no murmurs, rubs, or gallops Lungs: Diminished breath sounds at lung bases Abd: Soft, nontender, no hepatomegaly  Ext: No edema, pulses 2+ Musculoskeletal: No deformities, BUE and BLE strength normal and equal Skin: Warm and dry, no rashes   Neuro: Alert and oriented to person, place, time, and situation, CNII-XII grossly intact, no focal deficits  Psych: Normal mood and affect   ASSESSMENT:   Steven Tapia is a 63 y.o. male who presents for the following: 1. Idiopathic pericardial effusion   2. Pulmonary embolism without acute cor pulmonale, unspecified chronicity, unspecified pulmonary embolism type (HCC)   3. Essential hypertension   4. Postoperative atrial fibrillation (HCC)     PLAN:   1. Idiopathic pericardial effusion -He was admitted to the hospital on 9/29 found to have a pericardial effusion.   -TSH normal, HIV negative, no microorganisms seen on the pericardial fluid, cytology negative, grossly it was hemorrhagic, pericardial biopsy was negative for any malignancy or infectious source -A pericardiocentesis was unable to be performed, and he had to undergo a pericardial window on 9/29.  - His echocardiogram prior to discharge did not show any reaccumulation of fluid.  He reports he is doing better but still short of breath with exertion.  He was in the hospital for a number of days and did have cardiac surgery so this is likely expected.  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   - He will need to stay out of work until we repeat a chest x-ray and obtain a repeat echocardiogram.  I want to ensure that he has not had a reaccumulation of fluid since he is on Eliquis.  We will also check a CBC to make sure his blood counts are stable as he has been rechallenged with Eliquis.  I will work with him regarding disability.  I have asked him to start to increase his activity.   - The only real etiology was him being on Coumadin.  I guess this is possible that he possibly had a a viral illness that was relatively asymptomatic and this led to hemorrhagic effusion.  Reports per the cardiac surgeon was that it was a hemorrhagic effusion.  2. Pulmonary embolism without acute cor pulmonale, unspecified chronicity, unspecified pulmonary embolism type [facility]) -He was diagnosed in January 2020 with a pulmonary embolism.  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  I think that it will be prudent he remains on indefinite anticoagulation given his risk factors are still  there and have not resolved.  This will largely depend on if his pericardial fluid reaccumulates.  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  3. Essential hypertension -Blood pressure not at goal today.  We will stop his hydralazine and add losartan 50 mg daily.  I will recheck a BMP at his next visit in 3 weeks.  4. Postoperative atrial fibrillation (HCC) -He developed atrial fibrillation after his pericardial window on 9/29.  He underwent a cardioversion on 10/1.  He will need to remain on Eliquis for a minimum of 4 weeks post cardioversion (10/30) however we have plans for indefinite anticoagulation.  This will all depend on how a repeat x-ray and echocardiogram will.  EKG today shows normal sinus rhythm, this is reassuring.  Disposition: Return in about 3 weeks (around 04/30/2019).  Medication Adjustments/Labs and Tests Ordered: Current medicines are reviewed at length with the patient today.  Concerns regarding medicines are outlined above.  Orders Placed This Encounter  Procedures  . DG Chest 2 View  . CBC  . EKG 12-Lead  . ECHOCARDIOGRAM COMPLETE   Meds ordered this encounter  Medications  . losartan (COZAAR) 50 MG tablet    Sig: Take 1 tablet (50 mg total) by mouth daily.    Dispense:  30 tablet    Refill:  6    Patient Instructions  Medication Instructions:  STOP TAKING AMIODARONE  ON OCT 30 ,2020  STOP HYDRALAZINE  START TAKING LOSARTAN 50 MG ONE TABLET DAILY    If you need a refill on your cardiac medications before your next appointment, please call your pharmacy*  Lab Work: CBC   Testing/Procedures: WILL BE SCHEDULE AT [place] [facility] Your physician has requested that you have an echocardiogram. Echocardiography is  a painless test that uses sound waves to create images of your heart. It provides your doctor with information about the size and shape of your heart and how well your heart's chambers and valves are working. This procedure takes approximately one hour. There are no restrictions for this procedure.  AND  GO TO [place] FOR CHEST XRAY A chest x-ray takes a picture of the organs and structures inside the chest, including the heart, lungs, and blood vessels. This test can show several things, including, whether the heart is enlarges; whether fluid is building up in the lungs; and whether pacemaker / defibrillator leads are still in place.    Follow-Up: At [facility], you and your health needs are our priority.  As part of our continuing mission to provide you with exceptional heart care, we have created designated Provider Care Teams.  These Care Teams include your primary Cardiologist (physician) and Advanced Practice Providers (APPs -  Physician Assistants and Nurse Practitioners) who all work together to provide you with the care you need, when you need it.  Your next appointment:    1 MONTH  The format for your next appointment:   In Person  Provider:   Eleonore Chiquito, MD  Other Instruction  INCREASE [facility] AND ACTIVITY    Signed, [name] T. Audie Box, [place]  [address] [place], [place] [address] [phone]  04/09/2019 11:59 AM

## 2019-04-09 ENCOUNTER — Encounter: Payer: Self-pay | Admitting: Cardiovascular Disease

## 2019-04-09 ENCOUNTER — Other Ambulatory Visit: Payer: Self-pay

## 2019-04-09 ENCOUNTER — Ambulatory Visit (INDEPENDENT_AMBULATORY_CARE_PROVIDER_SITE_OTHER): Payer: Self-pay | Admitting: Cardiovascular Disease

## 2019-04-09 ENCOUNTER — Ambulatory Visit
Admission: RE | Admit: 2019-04-09 | Discharge: 2019-04-09 | Disposition: A | Discharge status: Home / Self Care | Payer: Self-pay | Source: Ambulatory Visit | Attending: Cardiovascular Disease | Admitting: Cardiovascular Disease

## 2019-04-09 VITALS — BP 154/84 | HR 105 | Temp 96.8°F | Ht 76.0 in | Wt 281.6 lb

## 2019-04-09 DIAGNOSIS — I3139 Other pericardial effusion (noninflammatory): Secondary | ICD-10-CM

## 2019-04-09 DIAGNOSIS — I313 Pericardial effusion (noninflammatory): Secondary | ICD-10-CM | POA: Diagnosis not present

## 2019-04-09 DIAGNOSIS — I4891 Unspecified atrial fibrillation: Secondary | ICD-10-CM

## 2019-04-09 DIAGNOSIS — I2699 Other pulmonary embolism without acute cor pulmonale: Secondary | ICD-10-CM | POA: Diagnosis not present

## 2019-04-09 DIAGNOSIS — I10 Essential (primary) hypertension: Secondary | ICD-10-CM

## 2019-04-09 DIAGNOSIS — I9789 Other postprocedural complications and disorders of the circulatory system, not elsewhere classified: Secondary | ICD-10-CM

## 2019-04-09 MED ORDER — LOSARTAN POTASSIUM 50 MG PO TABS: 50.0000 mg | ORAL_TABLET | Freq: Every day | ORAL | 6 refills | Status: DC

## 2019-04-09 NOTE — Patient Instructions (Addendum)
Medication Instructions:  STOP TAKING AMIODARONE  ON OCT 30 ,2020  STOP HYDRALAZINE  START TAKING LOSARTAN 50 MG ONE TABLET DAILY    If you need a refill on your cardiac medications before your next appointment, please call your pharmacy*  Lab Work: CBC   Testing/Procedures: WILL BE SCHEDULE AT [place] [facility] Your physician has requested that you have an echocardiogram. Echocardiography is a painless test that uses sound waves to create images of your heart. It provides your doctor with information about the size and shape of your heart and how well your heart's chambers and valves are working. This procedure takes approximately one hour. There are no restrictions for this procedure.  AND  GO TO [place] FOR CHEST XRAY A chest x-ray takes a picture of the organs and structures inside the chest, including the heart, lungs, and blood vessels. This test can show several things, including, whether the heart is enlarges; whether fluid is building up in the lungs; and whether pacemaker / defibrillator leads are still in place.    Follow-Up: At [facility], you and your health needs are our priority.  As part of our continuing mission to provide you with exceptional heart care, we have created designated Provider Care Teams.  These Care Teams include your primary Cardiologist (physician) and Advanced Practice Providers (APPs -  Physician Assistants and Nurse Practitioners) who all work together to provide you with the care you need, when you need it.  Your next appointment:    1 MONTH  The format for your next appointment:   In Person  Provider:   Eleonore Chiquito, MD  Other Instruction  INCREASE [facility] AND ACTIVITY

## 2019-04-10 ENCOUNTER — Telehealth: Payer: Self-pay | Admitting: Cardiovascular Disease

## 2019-04-10 LAB — CBC
Hematocrit: 42.2 % (ref 37.5–51.0)
Hemoglobin: 13.4 g/dL (ref 13.0–17.7)
MCH: 25.9 pg — ABNORMAL LOW (ref 26.6–33.0)
MCHC: 31.8 g/dL (ref 31.5–35.7)
MCV: 82 fL (ref 79–97)
Platelets: 407 10*3/uL (ref 150–450)
RBC: 5.18 x10E6/uL (ref 4.14–5.80)
RDW: 15.1 % (ref 11.6–15.4)
WBC: 4.3 10*3/uL (ref 3.4–10.8)

## 2019-04-10 NOTE — Telephone Encounter (Signed)
Called Steven Tapia. CXR clear and CBC shows normal hgb. I think he is tolerating eliquis well. He will increase his activity level as I suspect some of this SOB is related to deconditioning.   Evalina Field, MD

## 2019-04-17 ENCOUNTER — Other Ambulatory Visit (HOSPITAL_COMMUNITY): Payer: Self-pay

## 2019-04-17 ENCOUNTER — Other Ambulatory Visit: Payer: Self-pay

## 2019-04-17 ENCOUNTER — Ambulatory Visit (HOSPITAL_COMMUNITY): Payer: BC Managed Care – PPO | Attending: Cardiovascular Disease

## 2019-04-17 DIAGNOSIS — I2699 Other pulmonary embolism without acute cor pulmonale: Secondary | ICD-10-CM | POA: Insufficient documentation

## 2019-04-17 DIAGNOSIS — I9789 Other postprocedural complications and disorders of the circulatory system, not elsewhere classified: Secondary | ICD-10-CM | POA: Insufficient documentation

## 2019-04-17 DIAGNOSIS — I313 Pericardial effusion (noninflammatory): Secondary | ICD-10-CM | POA: Insufficient documentation

## 2019-04-17 DIAGNOSIS — I3139 Other pericardial effusion (noninflammatory): Secondary | ICD-10-CM

## 2019-04-17 DIAGNOSIS — I4891 Unspecified atrial fibrillation: Secondary | ICD-10-CM | POA: Diagnosis not present

## 2019-04-18 ENCOUNTER — Telehealth: Payer: Self-pay | Admitting: Cardiovascular Disease

## 2019-04-18 NOTE — Telephone Encounter (Signed)
Called Steven Tapia and let him know no effusion. Encouraged to keep exercising. Will see him in a few weeks.   [name] T. Audie Box, [place]  [address], [place], [place] [address] [phone]  9:17 AM

## 2019-04-29 NOTE — Progress Notes (Signed)
Cardiology Office Note:   Date:  04/30/2019  NAME:  Steven Tapia    MRN: [id number] DOB:  March 06, 1956   PCP:  Ronney Lion, NP  Cardiologist:  Reatha Harps, MD  Electrophysiologist:  None   Referring MD: Ronney Lion, NP   Chief Complaint  Patient presents with  . Follow-up    1 month.   History of Present Illness:   Steven Tapia is a 63 y.o. male with a hx of hypertension, DVT/PE, recent pericardial effusion with cardiac tamponade status post pericardial window who presents for follow-up of his pericardial effusion. Re-challenged on eliquis and doing well. Recent TTE doesn't show evidence of a pericardial effusion. Recent CXR without evidence of pleural effusion. Hemoglobin stable.  He reports he is doing well today.  He started back exercising and has no limitations.  He reports his shortness of breath and chest pain gotten better.  He is healed up from his pericardial window.  No complaints today.  Blood counts were stable and chest x-ray look good.  Overall I am very happy with how he is progressing.  All the cytology and microbiology that was sent for his pericardial effusion came back negative.  His EKG is normal sinus rhythm he is stopped his amiodarone.  He is tolerating Eliquis well without any issues of bleeding.  Doing well overall.   Past Medical History: Past Medical History:  Diagnosis Date  . Cardiac/pericardial tamponade    a. 03/2019 s/p pericardial window-->Path: inflammation; Cytology: no malignant cells.  . Diabetes mellitus without complication (HCC)   . Hypertension   . PE (pulmonary thromboembolism) (HCC)     Past Surgical History: Past Surgical History:  Procedure Laterality Date  . ACHILLES TENDON REPAIR    . ACHILLES TENDON SURGERY    . CARDIOVERSION N/A 03/20/2019   Procedure: CARDIOVERSION;  Surgeon: Jodelle Red, MD;  Location: [facility] ENDOSCOPY;  Service: Cardiovascular;  Laterality: N/A;  . PERICARDIOCENTESIS N/A 03/18/2019   Procedure:  PERICARDIOCENTESIS;  Surgeon: Corky Crafts, MD;  Location: [facility] INVASIVE CV LAB;  Service: Cardiovascular;  Laterality: N/A;  . VIDEO ASSISTED THORACOSCOPY (VATS)/WEDGE RESECTION Right 03/18/2019   Procedure: RIGHT VIDEO ASSISTED THORACOSCOPY (VATS) PERICARDIAL WINDOW;  Surgeon: Corliss Skains, MD;  Location: [facility] OR;  Service: Thoracic;  Laterality: Right;    Current Medications: Current Meds  Medication Sig  . amiodarone (PACERONE) 200 MG tablet Take 1 tablet (200 mg total) by mouth daily.  Marland Kitchen amLODipine (NORVASC) 10 MG tablet Take 1 tablet (10 mg total) by mouth daily. For BP  . apixaban (ELIQUIS) 5 MG TABS tablet Take 1 tablet (5 mg total) by mouth 2 (two) times daily.  Marland Kitchen atorvastatin (LIPITOR) 20 MG tablet Take 20 mg by mouth daily.  . furosemide (LASIX) 40 MG tablet Take 1 tablet (40 mg total) by mouth daily.  Marland Kitchen losartan (COZAAR) 50 MG tablet Take 1 tablet (50 mg total) by mouth daily.  . metFORMIN (GLUCOPHAGE) 500 MG tablet Take 1 tablet (500 mg total) by mouth 2 (two) times daily with a meal. Start on 06/28/2018  . potassium chloride 20 MEQ TBCR Take 20 mEq by mouth daily.     Allergies:    Patient has no known allergies.   Social History: Social History   Socioeconomic History  . Marital status: Married    Spouse name: Not on file  . Number of children: Not on file  . Years of education: Not on file  . Highest education level: Not on file  Occupational History  . Not on file  Social Needs  . Financial resource strain: Not on file  . Food insecurity    Worry: Not on file    Inability: Not on file  . Transportation needs    Medical: Not on file    Non-medical: Not on file  Tobacco Use  . Smoking status: Never Smoker  . Smokeless tobacco: Never Used  Substance and Sexual Activity  . Alcohol use: Never    Frequency: Never  . Drug use: Never  . Sexual activity: Not on file  Lifestyle  . Physical activity    Days per week: Not on file    Minutes per  session: Not on file  . Stress: Not on file  Relationships  . Social Musicianconnections    Talks on phone: Not on file    Gets together: Not on file    Attends religious service: Not on file    Active member of club or organization: Not on file    Attends meetings of clubs or organizations: Not on file    Relationship status: Not on file  Other Topics Concern  . Not on file  Social History Narrative  . Not on file     Family History: The patient's family history includes Alzheimer's disease in his mother; Clotting disorder in his father; Diabetes in his sister.  ROS:   All other ROS reviewed and negative. Pertinent positives noted in the HPI.     EKGs/Labs/Other Studies Reviewed:   The following studies were personally reviewed by me today:  EKG:  EKG is ordered today.  EKG demonstrates normal sinus rhythm, left atrial abnormality, right bundle branch block, nonspecific ST-T changes and was personally reviewed by me.   Pericardial biopsy: A. PERICARDIUM, BIOPSY:  - Pericardium with reactive mesothelial cells and chronic inflammation.  - No malignancy identified.  COMMENT:  Immunohistochemistry highlights reactive mesothelial cells  (pancytokeratin, calretinin, WT-1 positive). TTF-1 and MOC31 are  negative.   Pericardial Fluid analysis: Clinical History: Pericardial fluid  Specimen Submitted: A. PERICARDIUM, PERICARDIOCENTESS  DIAGNOSIS:  - No malignant cells identified  SPECIMEN ADEQUACY:  Satisfactory for evaluation  GROSS:  Received is/are 40 cc's of bloody fluid.  Smears: 0  Concentration Method (ThinPrep): 1  Cell Block: Cell bliock attempted but not obtained  Additional Studies:   Specimen Description FLUID PERICARDIAL   Special Requests PATIENT ON FOLLOWING ANCEF 3G   Gram Stain ABUNDANT WBC PRESENT,BOTH PMN AND MONONUCLEAR  NO ORGANISMS SEEN   Culture NO GROWTH 3 DAYS  Performed at [facility], [address] [place], [place] [address]   Report Status  03/22/2019 FINAL    HIV negative   Covid 19 negative  TTE 04/17/2019  1. Left ventricular ejection fraction, by visual estimation, is 60 to 65%. The left ventricle has normal function. There is moderately increased left ventricular hypertrophy.  2. Left ventricular diastolic parameters are consistent with Grade II diastolic dysfunction (pseudonormalization).  3. Global right ventricle has normal systolic function.The right ventricular size is normal. No increase in right ventricular wall thickness.  4. Left atrial size was normal.  5. Right atrial size was normal.  6. The mitral valve is normal in structure. No evidence of mitral valve regurgitation. No evidence of mitral stenosis.  7. The tricuspid valve is normal in structure. Tricuspid valve regurgitation is not demonstrated.  8. The aortic valve is normal in structure. Aortic valve regurgitation is not visualized.  9. The pulmonic valve was normal  in structure. Pulmonic valve regurgitation is not visualized. 10. The atrial septum is grossly normal.  Recent Labs: 03/17/2019: TSH 0.466 03/18/2019: B Natriuretic Peptide 13.8 03/22/2019: ALT 31; BUN 19; Creatinine, Ser 1.09; Potassium 3.5; Sodium 136 04/09/2019: Hemoglobin 13.4; Platelets 407   Recent Lipid Panel No results found for: CHOL, TRIG, HDL, CHOLHDL, VLDL, LDLCALC, LDLDIRECT  Physical Exam:   VS:  BP 132/80 (BP Location: Left Arm, Patient Position: Sitting, Cuff Size: Normal)   Pulse 94   Temp (!) 97.4 F (36.3 C)   Ht 6\' 4"  (1.93 m)   Wt 284 lb (128.8 kg)   BMI 34.57 kg/m    Wt Readings from Last 3 Encounters:  04/30/19 284 lb (128.8 kg)  04/09/19 281 lb 9.6 oz (127.7 kg)  03/28/19 284 lb (128.8 kg)    General: Well nourished, well developed, in no acute distress Heart: Atraumatic, normal size  Eyes: PEERLA, EOMI  Neck: Supple, no JVD Endocrine: No thryomegaly Cardiac: Normal S1, S2; RRR; no murmurs, rubs, or gallops Lungs: Diminished breath sounds at lung bases  Abd: Soft, nontender, no hepatomegaly  Ext: No edema, pulses 2+ Musculoskeletal: No deformities, BUE and BLE strength normal and equal Skin: Warm and dry, no rashes   Neuro: Alert and oriented to person, place, time, and situation, CNII-XII grossly intact, no focal deficits  Psych: Normal mood and affect   ASSESSMENT:   Steven Tapia is a 63 y.o. male who presents for the following: 1. Pericardial effusion   2. PE (pulmonary thromboembolism) (HCC)   3. Persistent atrial fibrillation ([place])   4. Essential hypertension     PLAN:   1. Idiopathic pericardial effusion -He was admitted to the hospital on 9/29 found to have a pericardial effusion.   -TSH normal, HIV negative, no microorganisms seen on the pericardial fluid, cytology negative, grossly it was hemorrhagic, pericardial biopsy was negative for any malignancy or infectious source -A pericardiocentesis was unable to be performed, and he had to undergo a pericardial window on 9/29.  -Most recent echocardiogram without any evidence of pericardial effusion.  He is tolerating Eliquis well.  This appears to have been Coumadin related and is all resolved. -I filled out disability work for him today.  We will let him return to work without limitations.  2. Pulmonary embolism without acute cor pulmonale, unspecified chronicity, unspecified pulmonary embolism type [facility]) -He was diagnosed in January 2020 with a pulmonary embolism.  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  I think that it will be prudent he remains on indefinite anticoagulation given his risk factors are still there and have not resolved.   -Most recent echocardiogram without evidence of pericardial effusion return.  I think it is safe to continue him on Eliquis.  Blood counts are stable.  We will continue this likely indefinitely.  3. Essential hypertension -Continue losartan and amlodipine -At goal today -We did start him on losartan at her last  visit we will check a BMP today  4. Postoperative atrial fibrillation (HCC) -He developed atrial fibrillation after his pericardial window on 9/29.  He underwent a cardioversion on 10/1.  He will need to remain on Eliquis for a minimum of 4 weeks post cardioversion (10/30) however we have plans for indefinite anticoagulation.   -EKG in sinus rhythm today.  He is complete his oral amiodarone load.  We have stopped this.  I do not suspect this to recur.  Disposition: Return in about 6 months (around 10/28/2019).  Medication Adjustments/Labs  and Tests Ordered: Current medicines are reviewed at length with the patient today.  Concerns regarding medicines are outlined above.  Orders Placed This Encounter  Procedures  . Basic metabolic panel  . EKG 12-Lead   No orders of the defined types were placed in this encounter.   Patient Instructions  Medication Instructions:  NO CHANGES  *If you need a refill on your cardiac medications before your next appointment, please call your pharmacy*  Lab Work: [facility] If you have labs (blood work) drawn today and your tests are completely normal, you will receive your results only by: Marland Kitchen MyChart Message (if you have MyChart) OR . A paper copy in the mail If you have any lab test that is abnormal or we need to change your treatment, we will call you to review the results.  Testing/Procedures:  NOT NEEDED Follow-Up: At [facility], you and your health needs are our priority.  As part of our continuing mission to provide you with exceptional heart care, we have created designated Provider Care Teams.  These Care Teams include your primary Cardiologist (physician) and Advanced Practice Providers (APPs -  Physician Assistants and Nurse Practitioners) who all work together to provide you with the care you need, when you need it.  Your next appointment:   6 months  The format for your next appointment:   In Person  Provider:   Lennie Odor, MD  Other  Instructions     Signed, Lenna Gilford. Flora Lipps, MD [facility]  [address] [place], [place] [address] [phone]  04/30/2019 1:15 PM

## 2019-04-30 ENCOUNTER — Ambulatory Visit (INDEPENDENT_AMBULATORY_CARE_PROVIDER_SITE_OTHER): Payer: BC Managed Care – PPO | Admitting: Cardiovascular Disease

## 2019-04-30 ENCOUNTER — Other Ambulatory Visit: Payer: Self-pay

## 2019-04-30 ENCOUNTER — Encounter: Payer: Self-pay | Admitting: Cardiovascular Disease

## 2019-04-30 VITALS — BP 132/80 | HR 94 | Temp 97.4°F | Ht 76.0 in | Wt 284.0 lb

## 2019-04-30 DIAGNOSIS — I4819 Other persistent atrial fibrillation: Secondary | ICD-10-CM | POA: Diagnosis not present

## 2019-04-30 DIAGNOSIS — I2699 Other pulmonary embolism without acute cor pulmonale: Secondary | ICD-10-CM

## 2019-04-30 DIAGNOSIS — I3139 Other pericardial effusion (noninflammatory): Secondary | ICD-10-CM

## 2019-04-30 DIAGNOSIS — I313 Pericardial effusion (noninflammatory): Secondary | ICD-10-CM | POA: Diagnosis not present

## 2019-04-30 DIAGNOSIS — I10 Essential (primary) hypertension: Secondary | ICD-10-CM | POA: Diagnosis not present

## 2019-04-30 NOTE — Patient Instructions (Addendum)
Medication Instructions:  NO CHANGES  *If you need a refill on your cardiac medications before your next appointment, please call your pharmacy*  Lab Work: [facility] If you have labs (blood work) drawn today and your tests are completely normal, you will receive your results only by: Marland Kitchen MyChart Message (if you have MyChart) OR . A paper copy in the mail If you have any lab test that is abnormal or we need to change your treatment, we will call you to review the results.  Testing/Procedures:  NOT NEEDED Follow-Up: At [facility], you and your health needs are our priority.  As part of our continuing mission to provide you with exceptional heart care, we have created designated Provider Care Teams.  These Care Teams include your primary Cardiologist (physician) and Advanced Practice Providers (APPs -  Physician Assistants and Nurse Practitioners) who all work together to provide you with the care you need, when you need it.  Your next appointment:   6 months  The format for your next appointment:   In Person  Provider:   Eleonore Chiquito, MD  Other Instructions

## 2019-05-01 LAB — BASIC METABOLIC PANEL
BUN/Creatinine Ratio: 13 (ref 10–24)
BUN: 15 mg/dL (ref 8–27)
CO2: 24 mmol/L (ref 20–29)
Calcium: 9.1 mg/dL (ref 8.6–10.2)
Chloride: 100 mmol/L (ref 96–106)
Creatinine, Ser: 1.19 mg/dL (ref 0.76–1.27)
GFR calc Af Amer: 76 mL/min/{1.73_m2} (ref >59)
GFR calc non Af Amer: 66 mL/min/{1.73_m2} (ref >59)
Glucose: 90 mg/dL (ref 65–99)
Potassium: 3.8 mmol/L (ref 3.5–5.2)
Sodium: 140 mmol/L (ref 134–144)

## 2019-05-07 ENCOUNTER — Other Ambulatory Visit (HOSPITAL_COMMUNITY): Payer: Self-pay

## 2019-05-26 ENCOUNTER — Telehealth: Payer: Self-pay | Admitting: Cardiovascular Disease

## 2019-05-26 ENCOUNTER — Ambulatory Visit: Payer: Self-pay | Admitting: Cardiovascular Disease

## 2019-05-26 NOTE — Telephone Encounter (Signed)
On 05/07/2019, patient signed [employer] prescription reimbursement form. It was brought to office and was in MD mailbox. It was not denoted what needs to be done with the form, as there is no space for MD to sign. A pharmacist signed the form.   Unsure which medication this form is in reference to  [facility] for patient to call back to discuss

## 2019-05-27 ENCOUNTER — Other Ambulatory Visit: Payer: Self-pay | Admitting: *Deleted

## 2019-05-27 MED ORDER — POTASSIUM CHLORIDE ER 20 MEQ PO TBCR: 20.0000 meq | EXTENDED_RELEASE_TABLET | Freq: Every day | ORAL | 1 refills | Status: DC

## 2019-05-27 NOTE — Telephone Encounter (Signed)
Spoke with pt, form mailed to his confirmed home address.

## 2019-09-17 ENCOUNTER — Other Ambulatory Visit: Payer: Self-pay | Admitting: Cardiovascular Disease

## 2019-09-29 ENCOUNTER — Telehealth: Payer: Self-pay

## 2019-09-29 NOTE — Telephone Encounter (Signed)
Let's switch him to xarelto 20 mg daily. Stop eliquis and start this.   Gerri Spore T. Flora Lipps, MD [facility]  [address], [address] [place], [place] [address] [phone]  3:31 PM

## 2019-09-29 NOTE — Telephone Encounter (Signed)
Sent to primary nurse for follow up

## 2019-09-29 NOTE — Telephone Encounter (Signed)
Called patient.  No answer. LMOV for patient to call back to make him aware of medication change.

## 2019-09-29 NOTE — Telephone Encounter (Signed)
Incoming fax from [facility] Stating:  Eliquis 5MG  tablets has been rejected by insurance. Non-preferred medications may have a higher patient co-pay than the health insurance plan's preferred medications. Using available formulary data, we believe that the following medications are more likely to be covered.   Drug Name     PA Requirement  Enoxaparin 40 Mg/0.4 Ml Syrg tier-4  NOT Required* Warfarin 5 Mg Tab tier-5   NOT Required* Xarelto 20 Mg Tab tier-1   NOT Required* Xarelto DVT-PE Treat 30D Start tier-3 NOT Required* Pradaxa 150 Mg Cap tier-2   Required* Savaysa 60 Mg Tab tier-6   Required*

## 2019-09-30 MED ORDER — RIVAROXABAN 20 MG PO TABS: 20.0000 mg | ORAL_TABLET | Freq: Every day | ORAL | 3 refills | Status: DC

## 2019-10-30 NOTE — Progress Notes (Signed)
Cardiology Office Note:   Date:  10/31/2019  NAME:  Steven Tapia    MRN: [id number] DOB:  1955/08/24   PCP:  Levin Bacon, NP  Cardiologist:  Evalina Field, MD   Referring MD: Levin Bacon, NP   Chief Complaint  Patient presents with  . Follow-up   History of Present Illness:   Steven Tapia is a 64 y.o. male with a hx of cardiac tamponade 2/2 hemorrhagic effusion, DVT/PE, Postop Afib, HTN who presents for follow-up.  He reports he is doing well.  Blood pressure bit elevated today.  He reports he is only on lisinopril 20 mg daily.  He is unclear if he is taking amlodipine.  He thinks he is not.  He has been switched to Xarelto by his insurance company.  There is been no issues with bleeding he reports.  He denies any chest pain or shortness of breath or palpitations.  He has restarted driving.  He describes no limitations with this.  He reports that he is not exercising like he should and has gained about 10 pounds at her last visit.  He has plans to increase his activity level.  He denies chest pain, palpitations or shortness of breath today.  His heart rate was noted to be a little irregular.  EKG demonstrates normal sinus rhythm with PAC.  Problem List 1. Cardiac Tamponade  03/18/2019 -hemorrhagic related to warfarin use; s/p pericardial window and back on DOAC without issues  -Admitted 03/18/2019 for pericardial effusion -TSH normal, HIV neg, micro negative, cyto negative 2. DVT  -06/2018 3. HTN 4. Postoperative Afib -occurred after pericardial window  -on [facility] for DVT/PE 5. RBBB -PACs 10/31/2019  Past Medical History: Past Medical History:  Diagnosis Date  . Cardiac/pericardial tamponade    a. 03/2019 s/p pericardial window-->Path: inflammation; Cytology: no malignant cells.  . Diabetes mellitus without complication ([place])   . Hypertension   . PE (pulmonary thromboembolism) ([place])     Past Surgical History: Past Surgical History:  Procedure Laterality Date  . ACHILLES  TENDON REPAIR    . ACHILLES TENDON SURGERY    . CARDIOVERSION N/A 03/20/2019   Procedure: CARDIOVERSION;  Surgeon: Buford Dresser, MD;  Location: [facility] ENDOSCOPY;  Service: Cardiovascular;  Laterality: N/A;  . PERICARDIOCENTESIS N/A 03/18/2019   Procedure: PERICARDIOCENTESIS;  Surgeon: Jettie Booze, MD;  Location: [facility] CV LAB;  Service: Cardiovascular;  Laterality: N/A;  . VIDEO ASSISTED THORACOSCOPY (VATS)/WEDGE RESECTION Right 03/18/2019   Procedure: RIGHT VIDEO ASSISTED THORACOSCOPY (VATS) PERICARDIAL WINDOW;  Surgeon: Lajuana Matte, MD;  Location: [facility] OR;  Service: Thoracic;  Laterality: Right;    Current Medications: Current Meds  Medication Sig  . amLODipine (NORVASC) 10 MG tablet Take 1 tablet (10 mg total) by mouth daily. For BP  . atorvastatin (LIPITOR) 20 MG tablet Take 20 mg by mouth daily.  Marland Kitchen lisinopril (ZESTRIL) 20 MG tablet Take 20 mg by mouth daily.  . metFORMIN (GLUCOPHAGE) 500 MG tablet Take 1 tablet (500 mg total) by mouth 2 (two) times daily with a meal. Start on 06/28/2018  . rivaroxaban (XARELTO) 20 MG TABS tablet Take 1 tablet (20 mg total) by mouth daily with supper.  . [DISCONTINUED] amLODipine (NORVASC) 10 MG tablet Take 1 tablet (10 mg total) by mouth daily. For BP  . [DISCONTINUED] rivaroxaban (XARELTO) 20 MG TABS tablet Take 1 tablet (20 mg total) by mouth daily with supper.     Allergies:    Patient has no known allergies.   Social  History: Social History   Socioeconomic History  . Marital status: Married    Spouse name: Not on file  . Number of children: Not on file  . Years of education: Not on file  . Highest education level: Not on file  Occupational History  . Not on file  Tobacco Use  . Smoking status: Never Smoker  . Smokeless tobacco: Never Used  Substance and Sexual Activity  . Alcohol use: Never  . Drug use: Never  . Sexual activity: Not on file  Other Topics Concern  . Not on file  Social History Narrative  .  Not on file   Social Determinants of Health   Financial Resource Strain:   . Difficulty of Paying Living Expenses:   Food Insecurity:   . Worried About Programme researcher, broadcasting/film/video in the Last Year:   . Barista in the Last Year:   Transportation Needs:   . Freight forwarder (Medical):   Marland Kitchen Lack of Transportation (Non-Medical):   Physical Activity:   . Days of Exercise per Week:   . Minutes of Exercise per Session:   Stress:   . Feeling of Stress :   Social Connections:   . Frequency of Communication with Friends and Family:   . Frequency of Social Gatherings with Friends and Family:   . Attends Religious Services:   . Active Member of Clubs or Organizations:   . Attends Banker Meetings:   Marland Kitchen Marital Status:      Family History: The patient's family history includes Alzheimer's disease in his mother; Clotting disorder in his father; Diabetes in his sister.  ROS:   All other ROS reviewed and negative. Pertinent positives noted in the HPI.     EKGs/Labs/Other Studies Reviewed:   The following studies were personally reviewed by me today:  EKG:  EKG is ordered today.  The ekg ordered today demonstrates sinus rhythm, right bundle branch block, first-degree block, PAC, and was personally reviewed by me.   Recent Labs: 03/17/2019: TSH 0.466 03/18/2019: B Natriuretic Peptide 13.8 03/22/2019: ALT 31 04/09/2019: Hemoglobin 13.4; Platelets 407 04/30/2019: BUN 15; Creatinine, Ser 1.19; Potassium 3.8; Sodium 140   Recent Lipid Panel No results found for: CHOL, TRIG, HDL, CHOLHDL, VLDL, LDLCALC, LDLDIRECT  Physical Exam:   VS:  BP (!) 182/98   Pulse 100   Ht 6\' 4"  (1.93 m)   Wt 298 lb 12.8 oz (135.5 kg)   SpO2 94%   BMI 36.37 kg/m    Wt Readings from Last 3 Encounters:  10/31/19 298 lb 12.8 oz (135.5 kg)  04/30/19 284 lb (128.8 kg)  04/09/19 281 lb 9.6 oz (127.7 kg)    General: Well nourished, well developed, in no acute distress Heart: Atraumatic, normal  size  Eyes: PEERLA, EOMI  Neck: Supple, no JVD Endocrine: No thryomegaly Cardiac: Normal S1, S2; RRR; no murmurs, rubs, or gallops Lungs: Clear to auscultation bilaterally, no wheezing, rhonchi or rales  Abd: Soft, nontender, no hepatomegaly  Ext: No edema, pulses 2+ Musculoskeletal: No deformities, BUE and BLE strength normal and equal Skin: Warm and dry, no rashes   Neuro: Alert and oriented to person, place, time, and situation, CNII-XII grossly intact, no focal deficits  Psych: Normal mood and affect   ASSESSMENT:   Blase Beckner is a 63 y.o. male who presents for the following: 1. Pericardial effusion   2. Cardiac/pericardial tamponade   3. Paroxysmal atrial fibrillation (HCC)   4. PE (pulmonary thromboembolism) (HCC)  5. Essential hypertension     PLAN:   1. Pericardial effusion with tamponade  2. Cardiac/pericardial tamponade -03/18/2019.  He had been on Coumadin due to a pulmonary Boseman.  He was admitted in September 2020 with pericardial tamponade. -s/p pericardial window and back on DOAC without issues  -TSH normal, HIV neg, micro negative, cyto negative  3. Paroxysmal atrial fibrillation (HCC) -His EKG demonstrates sinus rhythm with a single PAC.  He has no right bundle branch block.  No recurrence of atrial fibrillation.  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  4. PE (pulmonary thromboembolism) (HCC) -He will remain on anticoagulation.  He was switched to Xarelto.  No issues with this.  We will continue this at 20 mg daily.  5. Essential hypertension -Blood pressure a bit elevated.  He reports he is only on lisinopril.  He was on amlodipine at our last visit and was well controlled.  We have reordered 10 mg amlodipine today.  Disposition: Return in about 1 year (around 10/30/2020).  Medication Adjustments/Labs and Tests Ordered: Current medicines are reviewed at length with the  patient today.  Concerns regarding medicines are outlined above.  Orders Placed This Encounter  Procedures  . EKG 12-Lead   Meds ordered this encounter  Medications  . amLODipine (NORVASC) 10 MG tablet    Sig: Take 1 tablet (10 mg total) by mouth daily. For BP    Dispense:  90 tablet    Refill:  3  . rivaroxaban (XARELTO) 20 MG TABS tablet    Sig: Take 1 tablet (20 mg total) by mouth daily with supper.    Dispense:  90 tablet    Refill:  3    Patient Instructions  Medication Instructions:   RESTART AMLODIPINE 10 MG ONCE DAILY   *If you need a refill on your cardiac medications before your next appointment, please call your pharmacy*   Lab Work: If you have labs (blood work) drawn today and your tests are completely normal, you will receive your results only by: Marland Kitchen MyChart Message (if you have MyChart) OR . A paper copy in the mail If you have any lab test that is abnormal or we need to change your treatment, we will call you to review the results.   Follow-Up: At [facility], you and your health needs are our priority.  As part of our continuing mission to provide you with exceptional heart care, we have created designated Provider Care Teams.  These Care Teams include your primary Cardiologist (physician) and Advanced Practice Providers (APPs -  Physician Assistants and Nurse Practitioners) who all work together to provide you with the care you need, when you need it.  We recommend signing up for the patient portal called "MyChart".  Sign up information is provided on this After Visit Summary.  MyChart is used to connect with patients for Virtual Visits (Telemedicine).  Patients are able to view lab/test results, encounter notes, upcoming appointments, etc.  Non-urgent messages can be sent to your provider as well.   To learn more about what you can do with MyChart, go to [email or web address].    Your next appointment:   12 month(s)  The format for your next  appointment:   Either In Person or Virtual  Provider:   You may see Reatha Harps, MD or one of the following Advanced Practice Providers on your designated Care Team:    Azalee Course, [place]  Micah Flesher, PA-C or   Judy Pimple, PA-C       Time Spent with Patient: I have spent a total of 25 minutes with patient reviewing hospital notes, telemetry, EKGs, labs and examining the patient as well as establishing an assessment and plan that was discussed with the patient.  > 50% of time was spent in direct patient care.  Signed, Lenna Gilford. Flora Lipps, MD [facility]  [address], [address] [place], [place] [address] [phone]  10/31/2019 3:57 PM

## 2019-10-31 ENCOUNTER — Encounter: Payer: Self-pay | Admitting: Cardiovascular Disease

## 2019-10-31 ENCOUNTER — Other Ambulatory Visit: Payer: Self-pay

## 2019-10-31 ENCOUNTER — Ambulatory Visit (INDEPENDENT_AMBULATORY_CARE_PROVIDER_SITE_OTHER): Payer: Managed Care, Other (non HMO) | Admitting: Cardiovascular Disease

## 2019-10-31 VITALS — BP 182/98 | HR 100 | Ht 76.0 in | Wt 298.8 lb

## 2019-10-31 DIAGNOSIS — I313 Pericardial effusion (noninflammatory): Secondary | ICD-10-CM | POA: Diagnosis not present

## 2019-10-31 DIAGNOSIS — I10 Essential (primary) hypertension: Secondary | ICD-10-CM

## 2019-10-31 DIAGNOSIS — I2699 Other pulmonary embolism without acute cor pulmonale: Secondary | ICD-10-CM

## 2019-10-31 DIAGNOSIS — I3139 Other pericardial effusion (noninflammatory): Secondary | ICD-10-CM

## 2019-10-31 DIAGNOSIS — I480 Paroxysmal atrial fibrillation: Secondary | ICD-10-CM

## 2019-10-31 DIAGNOSIS — I314 Cardiac tamponade: Secondary | ICD-10-CM | POA: Diagnosis not present

## 2019-10-31 MED ORDER — RIVAROXABAN 20 MG PO TABS: 20.0000 mg | ORAL_TABLET | Freq: Every day | ORAL | 3 refills | Status: DC

## 2019-10-31 MED ORDER — AMLODIPINE BESYLATE 10 MG PO TABS: 10.0000 mg | ORAL_TABLET | Freq: Every day | ORAL | 3 refills | Status: AC

## 2019-10-31 NOTE — Patient Instructions (Signed)
Medication Instructions:   RESTART AMLODIPINE 10 MG ONCE DAILY   *If you need a refill on your cardiac medications before your next appointment, please call your pharmacy*   Lab Work: If you have labs (blood work) drawn today and your tests are completely normal, you will receive your results only by: Marland Kitchen MyChart Message (if you have MyChart) OR . A paper copy in the mail If you have any lab test that is abnormal or we need to change your treatment, we will call you to review the results.   Follow-Up: At [facility], you and your health needs are our priority.  As part of our continuing mission to provide you with exceptional heart care, we have created designated Provider Care Teams.  These Care Teams include your primary Cardiologist (physician) and Advanced Practice Providers (APPs -  Physician Assistants and Nurse Practitioners) who all work together to provide you with the care you need, when you need it.  We recommend signing up for the patient portal called "MyChart".  Sign up information is provided on this After Visit Summary.  MyChart is used to connect with patients for Virtual Visits (Telemedicine).  Patients are able to view lab/test results, encounter notes, upcoming appointments, etc.  Non-urgent messages can be sent to your provider as well.   To learn more about what you can do with MyChart, go to [email or web address].    Your next appointment:   12 month(s)  The format for your next appointment:   Either In Person or Virtual  Provider:   You may see Reatha Harps, MD or one of the following Advanced Practice Providers on your designated Care Team:    Azalee Course, PA-C  Micah Flesher, PA-C or   Judy Pimple, [place]

## 2020-01-09 ENCOUNTER — Other Ambulatory Visit: Payer: Self-pay

## 2020-01-09 MED ORDER — RIVAROXABAN 20 MG PO TABS: 20.0000 mg | ORAL_TABLET | Freq: Every day | ORAL | 3 refills | Status: DC

## 2020-02-06 ENCOUNTER — Other Ambulatory Visit: Payer: Self-pay

## 2020-02-06 ENCOUNTER — Telehealth: Payer: Self-pay | Admitting: Cardiovascular Disease

## 2020-02-06 NOTE — Telephone Encounter (Signed)
*  STAT* If patient is at the pharmacy, call can be transferred to refill team.   1. Which medications need to be refilled? (please list name of each medication and dose if known)? rivaroxaban (XARELTO) 20 MG TABS tablet  2. Which pharmacy/location (including street and city if local pharmacy) is medication to be sent to? [id number] - [place], [place] - [address]  3. Do they need a 30 day or 90 day supply? 90 day

## 2020-02-09 ENCOUNTER — Other Ambulatory Visit: Payer: Self-pay

## 2020-02-09 ENCOUNTER — Telehealth: Payer: Self-pay

## 2020-02-09 MED ORDER — RIVAROXABAN 20 MG PO TABS: 20.0000 mg | ORAL_TABLET | Freq: Every day | ORAL | 3 refills | Status: DC

## 2020-02-09 NOTE — Telephone Encounter (Signed)
Xarelto was approved via covermymeds PA request.   Approvedtoday CaseId:[id number];Status:Approved;Review Type:Prior Auth;Coverage Start Date:02/09/2020;Coverage End Date:02/08/2021;  Medication sent to pharmacy.

## 2020-02-10 ENCOUNTER — Other Ambulatory Visit: Payer: Self-pay | Admitting: Pharmacist

## 2020-02-10 MED ORDER — RIVAROXABAN 20 MG PO TABS: 20.0000 mg | ORAL_TABLET | Freq: Every day | ORAL | 1 refills | Status: DC

## 2020-07-22 ENCOUNTER — Telehealth: Payer: Self-pay | Admitting: Cardiovascular Disease

## 2020-07-22 NOTE — Telephone Encounter (Signed)
Left message for patient to call back  

## 2020-07-22 NOTE — Telephone Encounter (Signed)
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 Scharlene Gloss in order to be able to have clearance to still drive. Please advise.

## 2021-01-12 ENCOUNTER — Telehealth: Payer: Self-pay

## 2021-01-12 NOTE — Telephone Encounter (Signed)
Received notification from CoverMyMeds that we need to renew for patients Xarelto 20 mg was needed.   Key: [id number] Last Name: Steven Tapia  DOB: 04-25-1956  Will route to [place]. Thanks!

## 2021-01-13 NOTE — Telephone Encounter (Signed)
**Note De-Identified Steven Tapia Obfuscation** I started a Xarelto PA through covermymeds. Key: [id number]

## 2021-01-18 NOTE — Telephone Encounter (Signed)
**Note De-Identified Steven Tapia Obfuscation** Steven Tapia Key: [id number] Outcome::Drug is covered by current benefit plan. No further PA activity needed Drug Xarelto 20MG  tablets Form: [employer] Electronic PA Form (2017 NCPDP  [employer] is aware of this approval.

## 2021-01-22 ENCOUNTER — Other Ambulatory Visit: Payer: Self-pay | Admitting: Cardiovascular Disease

## 2021-01-24 NOTE — Telephone Encounter (Signed)
Prescription refill request for Xarelto received.  Indication:atrial fib Last office visit:needs appt Weight:135.5 kg Age:65 SFS:ELTRV labs CrCl:needs labs  Prescription refilled

## 2021-01-28 NOTE — Telephone Encounter (Signed)
Received another fax from covermymeds, that states action is required by 01/28/2021 his Xarelto 20 mg tablets are about to expire. I see where this was taken care of already but just wanted to make you aware of this too.   Thanks!   Key: [id number]  Last name: Steven Tapia DOB: 03/22/1956

## 2021-01-28 NOTE — Telephone Encounter (Signed)
**Note De-Identified Halena Mohar Obfuscation** [facility] and was advised by the pharmacist that they have already reprocessed it and the pts ins is covering it now and that a PA is no longer required.. They advised me that they have notified the pt is is ready for pick up.

## 2021-02-14 NOTE — Progress Notes (Deleted)
02/14/21- 65 yoM never smoker for sleep evaluation with hx OSA Medical problem list includes- Pericardial effusion, Pulmonary Embolism/ Xarelto, HTN, Thyroid Nodule, DM2, Hyperlipidemia,  Epworth score- Body weight today- Covid vax-

## 2021-02-15 ENCOUNTER — Institutional Professional Consult (permissible substitution): Payer: Managed Care, Other (non HMO) | Admitting: Internal Medicine

## 2021-07-04 ENCOUNTER — Other Ambulatory Visit: Payer: Self-pay | Admitting: Cardiovascular Disease

## 2021-07-04 NOTE — Telephone Encounter (Signed)
Prescription refill request for Xarelto received.  Indication:Afib Last office visit:upcoming Weight:135.5 kg Age:66 [id number] Labs CrCl:Needs Labs  Prescription refilled

## 2021-07-07 IMAGING — CT CT ANGIO CHEST
2 of 6 series · 18 of 36 positions shown · IV contrast (omnipaque)
Comparison: Prior CT 06/24/2018.

CLINICAL DATA: Elevated blood pressure. History of diabetes and
pulmonary embolism.

EXAM:
CT ANGIOGRAPHY CHEST WITH CONTRAST
TECHNIQUE: Multidetector CT imaging of the chest was performed using the
standard protocol during bolus administration of intravenous
contrast. Multiplanar CT image reconstructions and MIPs were
obtained to evaluate the vascular anatomy.
CONTRAST:  100mL OMNIPAQUE IOHEXOL 350 MG/ML SOLN

[Series 5: thins · axial · 0.80mm/px · z∈[-287,-53]mm · 17 of 261 slices shown]
[im 14/261  lung]
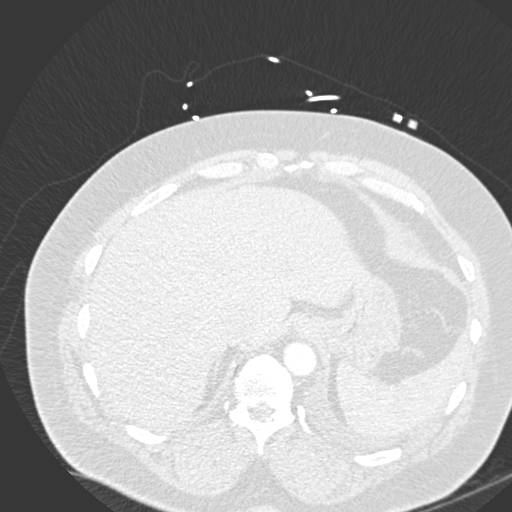
[im 27/261  mediastinal]
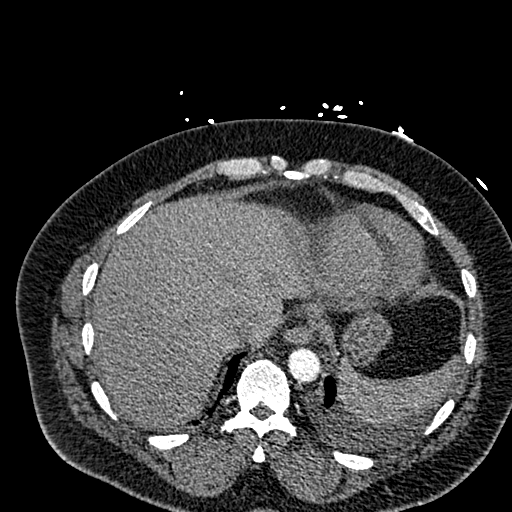
[im 40/261  lung]
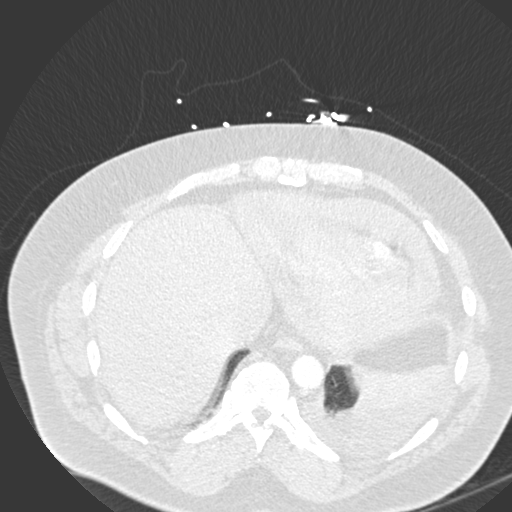
[im 53/261  mediastinal]
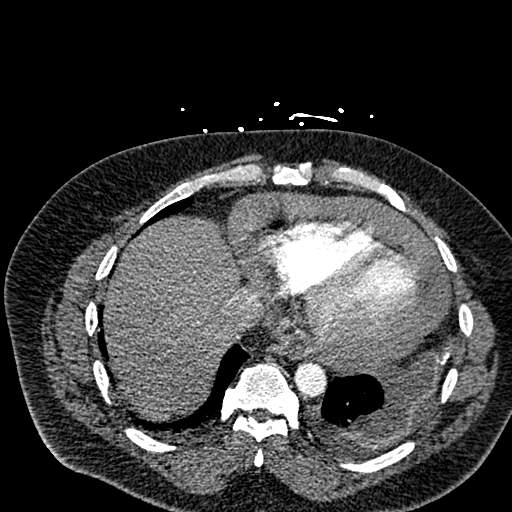
[im 79/261  lung]
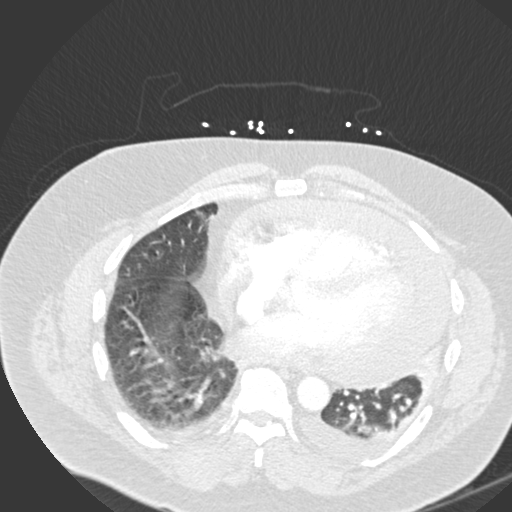
[im 92/261  mediastinal]
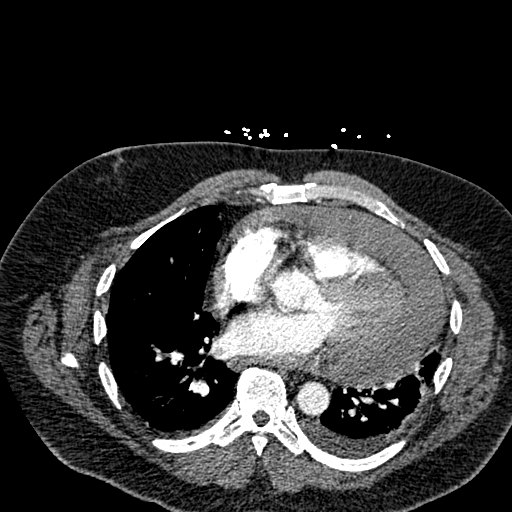
[im 105/261  lung]
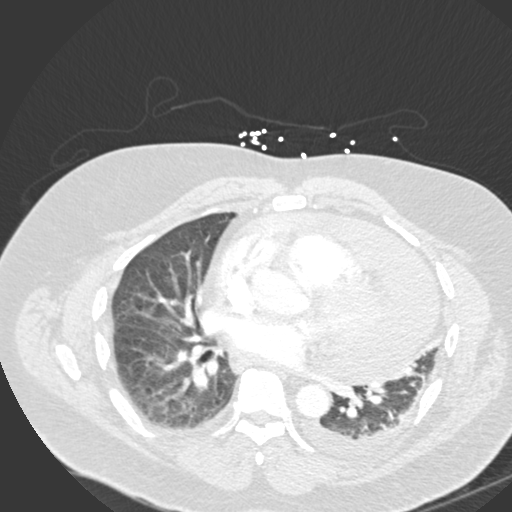
[im 118/261  mediastinal]
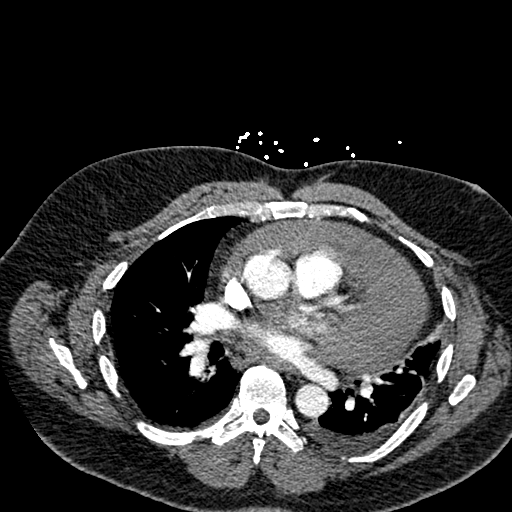
[im 131/261  lung]
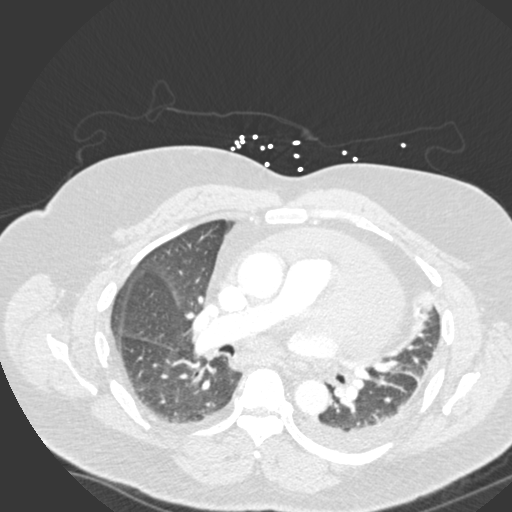
[im 144/261  mediastinal]
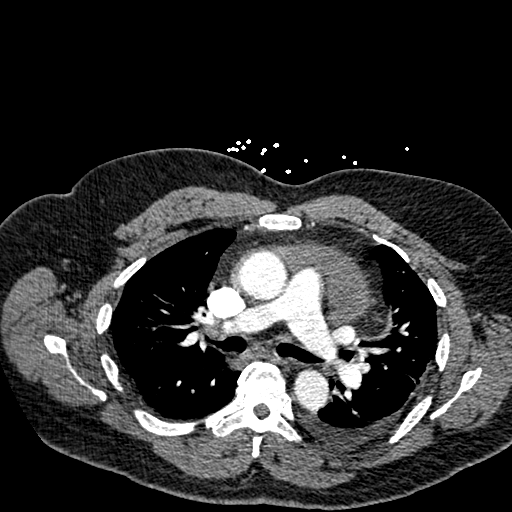
[im 157/261  lung]
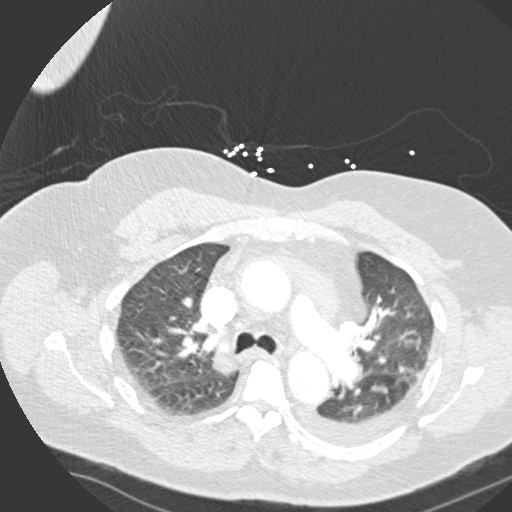
[im 170/261  mediastinal]
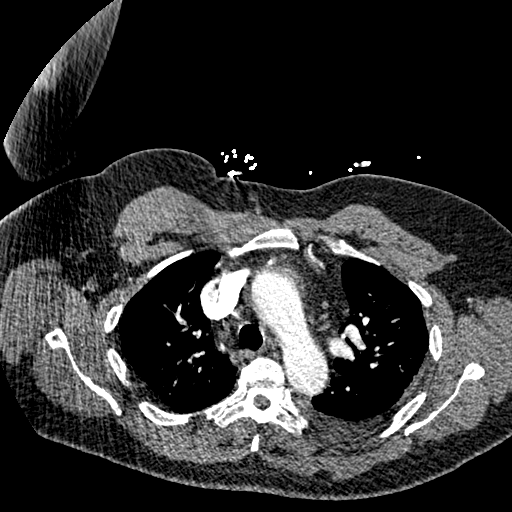
[im 183/261  lung]
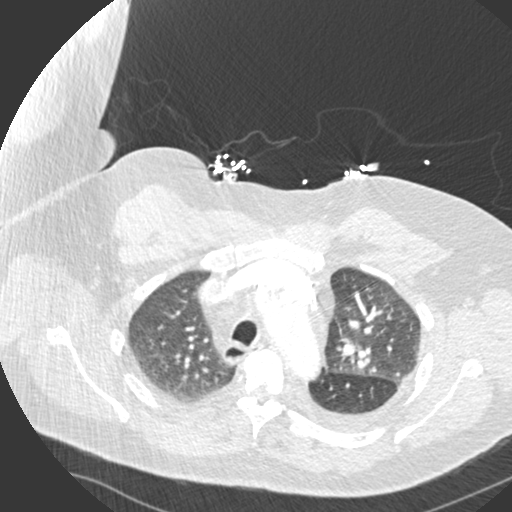
[im 209/261  mediastinal]
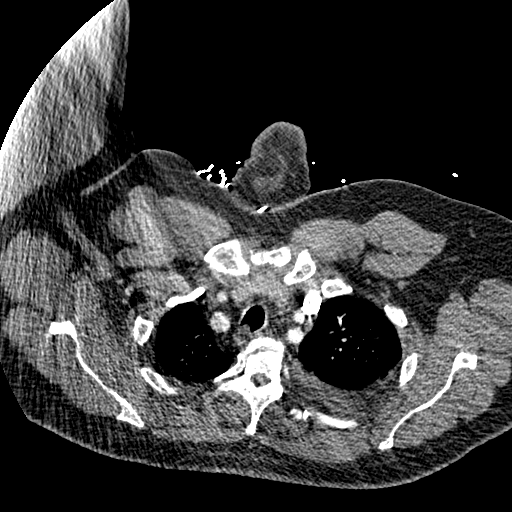
[im 222/261  lung]
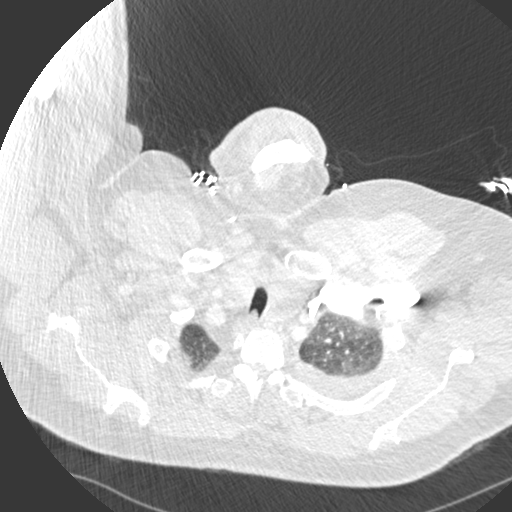
[im 235/261  mediastinal]
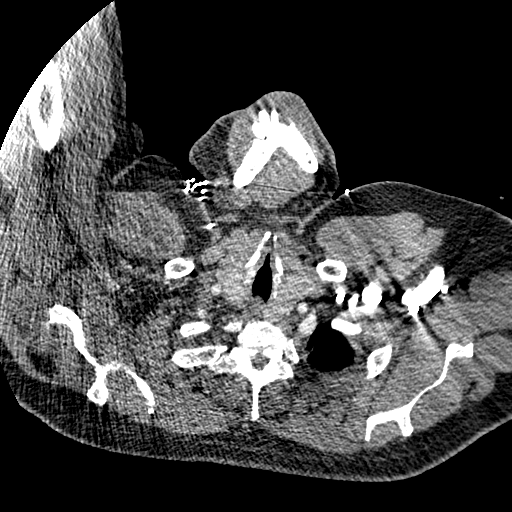
[im 248/261  lung]
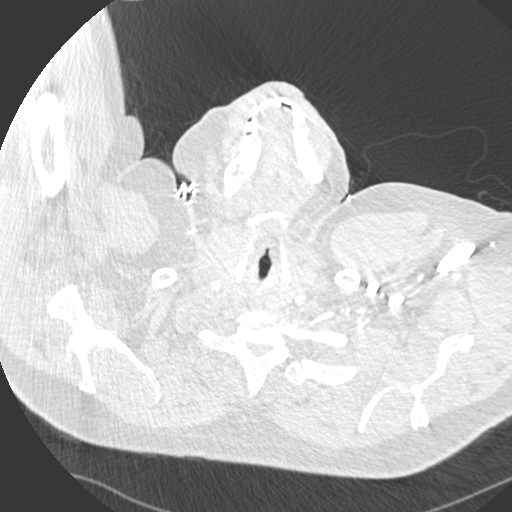

[Series 7: coronal mpr · coronal · 0.51mm/px · 1 of 166 slices shown]
[im 83/166  mediastinal]
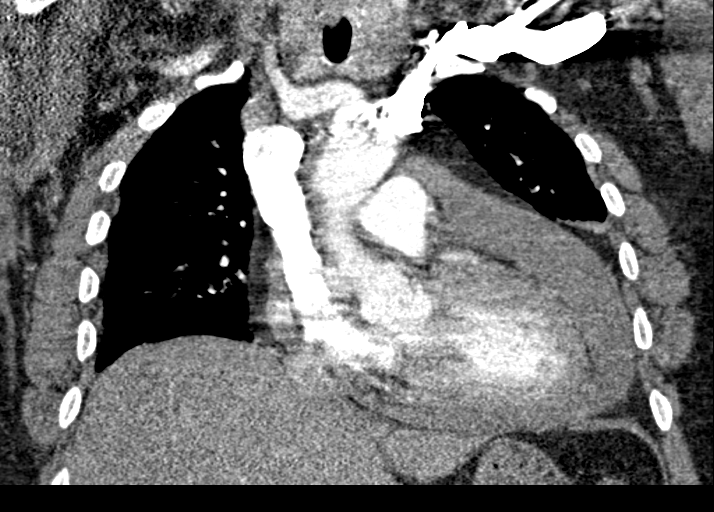

[18 of 36 positions shown; findings below may reference images not displayed]

FINDINGS: Cardiovascular: The pulmonary arteries are well opacified with
contrast to the level of the subsegmental branches. There is no
evidence of acute pulmonary embolism. The aorta and great vessels
demonstrate no significant findings. There is a new moderate to
large pericardial effusion. The heart size is normal. There is no
significant reflux of contrasted blood into the IVC or hepatic
veins.

Mediastinum/Nodes: There are no enlarged mediastinal, hilar or
axillary lymph nodes.Stable nodularity of the left thyroid lobe. The
trachea and esophagus appear normal.

Lungs/Pleura: New small left-greater-than-right pleural effusions
with associated dependent atelectasis at both lung bases. No
confluent airspace opacity, endobronchial lesion or suspicious
pulmonary nodule.

Upper abdomen:  The visualized upper abdomen appears unremarkable.

Musculoskeletal/Chest wall: There is no chest wall mass or
suspicious osseous finding.

Review of the MIP images confirms the above findings.
IMPRESSION: 1. No evidence of acute pulmonary embolism.
2. New moderate to large pericardial effusion without CT evidence of
tamponade. This could be symptomatic. Consider further evaluation
with echocardiography.
3. New small bilateral pleural effusions with associated bibasilar
atelectasis.
4. Stable nodularity of the left thyroid lobe, previously evaluated
by ultrasound.
5. These results were called by telephone at the time of
interpretation on 03/17/2019 at [DATE] to provider SAVIO LOCKLEAR ,
who verbally acknowledged these results.

## 2021-10-02 NOTE — Progress Notes (Deleted)
?Cardiology Office Note:   ?Date:  10/02/2021  ?NAME:  Steven Tapia    ?MRN: [id number] ?DOB:  15-Feb-1956  ? ?PCP:  Levin Bacon, NP  ?Cardiologist:  Evalina Field, MD  ?Electrophysiologist:  None  ? ?Referring MD: Levin Bacon, NP  ? ?No chief complaint on file. ?*** ? ?History of Present Illness:   ?Steven Tapia is a 66 y.o. male with a hx of pericardial effusion s/p window, DVT, RBBB who presents for follow-up ? ?Problem List ?1. Cardiac Tamponade  03/18/2019 ?-2/2 pericarditis  ?-hemorrhagic related to warfarin use; s/p pericardial window and back on DOAC without issues  ?-Admitted 03/18/2019 for pericardial effusion ?-TSH normal, HIV neg, micro negative, cyto negative ?2. DVT  ?-06/2018 ?3. HTN ?4. Postoperative Afib ?-occurred after pericardial window  ?-on [facility] for DVT/PE ?5. RBBB ?-PACs 10/31/2019 ? ?Past Medical History: ?Past Medical History:  ?Diagnosis Date  ? Cardiac/pericardial tamponade   ? a. 03/2019 s/p pericardial window-->Path: inflammation; Cytology: no malignant cells.  ? Diabetes mellitus without complication ([place])   ? Hypertension   ? PE (pulmonary thromboembolism) ([place])   ? ? ?Past Surgical History: ?Past Surgical History:  ?Procedure Laterality Date  ? ACHILLES TENDON REPAIR    ? ACHILLES TENDON SURGERY    ? CARDIOVERSION N/A 03/20/2019  ? Procedure: CARDIOVERSION;  Surgeon: Buford Dresser, MD;  Location: [facility] ENDOSCOPY;  Service: Cardiovascular;  Laterality: N/A;  ? PERICARDIOCENTESIS N/A 03/18/2019  ? Procedure: PERICARDIOCENTESIS;  Surgeon: Jettie Booze, MD;  Location: [facility] CV LAB;  Service: Cardiovascular;  Laterality: N/A;  ? VIDEO ASSISTED THORACOSCOPY (VATS)/WEDGE RESECTION Right 03/18/2019  ? Procedure: RIGHT VIDEO ASSISTED THORACOSCOPY (VATS) PERICARDIAL WINDOW;  Surgeon: Lajuana Matte, MD;  Location: [facility];  Service: Thoracic;  Laterality: Right;  ? ? ?Current Medications: ?No outpatient medications have been marked as taking for the 10/03/21 encounter  (Appointment) with O'Neal, Cassie Freer, MD.  ?  ? ?Allergies:    ?Patient has no known allergies.  ? ?Social History: ?Social History  ? ?Socioeconomic History  ? Marital status: Married  ?  Spouse name: Not on file  ? Number of children: Not on file  ? Years of education: Not on file  ? Highest education level: Not on file  ?Occupational History  ? Not on file  ?Tobacco Use  ? Smoking status: Never  ? Smokeless tobacco: Never  ?Vaping Use  ? Vaping Use: Never used  ?Substance and Sexual Activity  ? Alcohol use: Never  ? Drug use: Never  ? Sexual activity: Not on file  ?Other Topics Concern  ? Not on file  ?Social History Narrative  ? Not on file  ? ?Social Determinants of Health  ? ?Financial Resource Strain: Not on file  ?Food Insecurity: Not on file  ?Transportation Needs: Not on file  ?Physical Activity: Not on file  ?Stress: Not on file  ?Social Connections: Not on file  ?  ? ?Family History: ?The patient's ***family history includes Alzheimer's disease in his mother; Clotting disorder in his father; Diabetes in his sister. ? ?ROS:   ?All other ROS reviewed and negative. Pertinent positives noted in the HPI.    ? ?EKGs/Labs/Other Studies Reviewed:   ?The following studies were personally reviewed by me today: ? ?EKG:  EKG is *** ordered today.  The ekg ordered today demonstrates ***, and was personally reviewed by me.  ? ?TTE 04/17/2019 ? 1. Left ventricular ejection fraction, by visual estimation, is 60 to  ?65%. The left ventricle  has normal function. There is moderately increased  ?left ventricular hypertrophy.  ? 2. Left ventricular diastolic parameters are consistent with Grade II  ?diastolic dysfunction (pseudonormalization).  ? 3. Global right ventricle has normal systolic function.The right  ?ventricular size is normal. No increase in right ventricular wall  ?thickness.  ? 4. Left atrial size was normal.  ? 5. Right atrial size was normal.  ? 6. The mitral valve is normal in structure. No evidence of  mitral valve  ?regurgitation. No evidence of mitral stenosis.  ? 7. The tricuspid valve is normal in structure. Tricuspid valve  ?regurgitation is not demonstrated.  ? 8. The aortic valve is normal in structure. Aortic valve regurgitation is  ?not visualized.  ? 9. The pulmonic valve was normal in structure. Pulmonic valve  ?regurgitation is not visualized.  ?10. The atrial septum is grossly normal.  ? ?Recent Labs: ?No results found for requested labs within last 8760 hours.  ? ?Recent Lipid Panel ?No results found for: CHOL, TRIG, HDL, CHOLHDL, VLDL, LDLCALC, LDLDIRECT ? ?Physical Exam:   ?VS:  There were no vitals taken for this visit.   ?Wt Readings from Last 3 Encounters:  ?10/31/19 298 lb 12.8 oz (135.5 kg)  ?04/30/19 284 lb (128.8 kg)  ?04/09/19 281 lb 9.6 oz (127.7 kg)  ?  ?General: Well nourished, well developed, in no acute distress ?Head: Atraumatic, normal size  ?Eyes: PEERLA, EOMI  ?Neck: Supple, no JVD ?Endocrine: No thryomegaly ?Cardiac: Normal S1, S2; RRR; no murmurs, rubs, or gallops ?Lungs: Clear to auscultation bilaterally, no wheezing, rhonchi or rales  ?Abd: Soft, nontender, no hepatomegaly  ?Ext: No edema, pulses 2+ ?Musculoskeletal: No deformities, BUE and BLE strength normal and equal ?Skin: Warm and dry, no rashes   ?Neuro: Alert and oriented to person, place, time, and situation, CNII-XII grossly intact, no focal deficits  ?Psych: Normal mood and affect  ? ?ASSESSMENT:   ?Steven Tapia is a 66 y.o. male who presents for the following: ?No diagnosis found. ? ?PLAN:   ?There are no diagnoses linked to this encounter. ? ?{Are you ordering a CV Procedure (e.g. stress test, cath, DCCV, TEE, etc)?   Press F2        :[id number] ? ?Disposition: No follow-ups on file. ? ?Medication Adjustments/Labs and Tests Ordered: ?Current medicines are reviewed at length with the patient today.  Concerns regarding medicines are outlined above.  ?No orders of the defined types were placed in this encounter. ? ?No  orders of the defined types were placed in this encounter. ? ? ?There are no Patient Instructions on file for this visit.  ? ?Time Spent with Patient: I have spent a total of *** minutes with patient reviewing hospital notes, telemetry, EKGs, labs and examining the patient as well as establishing an assessment and plan that was discussed with the patient.  > 50% of time was spent in direct patient care. ? ?Signed, ?[name] T. Audie Box, MD, [facility] ?[address]  ?[address], [address] ?[place], [place] [address] ?[phone]  ?10/02/2021 6:01 PM    ? ?

## 2021-10-03 ENCOUNTER — Ambulatory Visit: Payer: Managed Care, Other (non HMO) | Admitting: Cardiovascular Disease

## 2021-10-03 DIAGNOSIS — I3139 Other pericardial effusion (noninflammatory): Secondary | ICD-10-CM

## 2021-10-03 DIAGNOSIS — I480 Paroxysmal atrial fibrillation: Secondary | ICD-10-CM

## 2021-10-03 DIAGNOSIS — I314 Cardiac tamponade: Secondary | ICD-10-CM

## 2021-10-07 ENCOUNTER — Encounter: Payer: Self-pay | Admitting: Cardiovascular Disease

## 2023-06-13 ENCOUNTER — Emergency Department (HOSPITAL_BASED_OUTPATIENT_CLINIC_OR_DEPARTMENT_OTHER)
Admission: EM | Admit: 2023-06-13 | Discharge: 2023-06-13 | Disposition: A | Discharge status: Home / Self Care | Payer: Managed Care, Other (non HMO) | Attending: Emergency Medicine | Admitting: Emergency Medicine

## 2023-06-13 ENCOUNTER — Other Ambulatory Visit: Payer: Self-pay

## 2023-06-13 ENCOUNTER — Encounter (HOSPITAL_BASED_OUTPATIENT_CLINIC_OR_DEPARTMENT_OTHER): Payer: Self-pay

## 2023-06-13 DIAGNOSIS — K5900 Constipation, unspecified: Secondary | ICD-10-CM | POA: Diagnosis present

## 2023-06-13 DIAGNOSIS — E119 Type 2 diabetes mellitus without complications: Secondary | ICD-10-CM | POA: Diagnosis not present

## 2023-06-13 DIAGNOSIS — Z79899 Other long term (current) drug therapy: Secondary | ICD-10-CM | POA: Diagnosis not present

## 2023-06-13 DIAGNOSIS — Z7984 Long term (current) use of oral hypoglycemic drugs: Secondary | ICD-10-CM | POA: Diagnosis not present

## 2023-06-13 DIAGNOSIS — I10 Essential (primary) hypertension: Secondary | ICD-10-CM | POA: Diagnosis not present

## 2023-06-13 DIAGNOSIS — Z7901 Long term (current) use of anticoagulants: Secondary | ICD-10-CM | POA: Insufficient documentation

## 2023-06-13 MED ORDER — FLEET ENEMA RE ENEM: 1.0000 | ENEMA | Freq: Once | RECTAL | 0 refills | Status: AC

## 2023-06-13 MED ORDER — DICYCLOMINE HCL 20 MG PO TABS: 20.0000 mg | ORAL_TABLET | Freq: Two times a day (BID) | ORAL | 0 refills | Status: AC

## 2023-06-13 MED ORDER — METAMUCIL SMOOTH TEXTURE 58.6 % PO POWD: 1.0000 | Freq: Three times a day (TID) | ORAL | 12 refills | Status: AC

## 2023-06-13 MED ORDER — ACETAMINOPHEN 325 MG PO TABS
650.0000 mg | ORAL_TABLET | Freq: Once | ORAL | Status: AC
  Administered 2023-06-13: 650 mg via ORAL
  Filled 2023-06-13: qty 2

## 2023-06-13 MED ORDER — DICYCLOMINE HCL 10 MG PO CAPS
10.0000 mg | ORAL_CAPSULE | Freq: Once | ORAL | Status: AC
  Administered 2023-06-13: 10 mg via ORAL
  Filled 2023-06-13: qty 1

## 2023-06-13 MED ORDER — POLYETHYLENE GLYCOL 3350 17 G PO PACK: 17.0000 g | PACK | Freq: Every day | ORAL | 0 refills | Status: AC

## 2023-06-13 MED ORDER — MILK OF MAGNESIA 7.75 % PO SUSP: 15.0000 mL | Freq: Every day | ORAL | 0 refills | Status: AC | PRN

## 2023-06-13 MED ORDER — SENNOSIDES-DOCUSATE SODIUM 8.6-50 MG PO TABS: 1.0000 | ORAL_TABLET | Freq: Every day | ORAL | 0 refills | Status: AC

## 2023-06-13 NOTE — ED Provider Notes (Signed)
[facility] EMERGENCY DEPARTMENT AT [facility] Provider Note   CSN: [id number] Arrival date & time: 06/13/23  1030     History  Chief Complaint  Patient presents with   Constipation    Steven Tapia is a 67 y.o. male.  Patient is a 67 year old medical history of hypertension, diabetes, prior PE on Xarelto presenting to the emergency department with constipation.  The patient states he has been constipated since Monday.  He states prior to this he was having regular bowel movements every day.  He states that he feels abdominal bloating.  He states that he tried taking Dulcolax last night and developed some cramping but did not have any bowel movement.  He states that he called EMS who recommended that he take a second Dulcolax which he did but is still been unable to have a bowel movement.  He states that he vomited once but is no longer feeling nauseous.  He states he is passing gas.  Has had no fevers.  He denies any chronic pain medicine use or any recent changes in diet.  The history is provided by the patient and the spouse.  Constipation      Home Medications Prior to Admission medications   Medication Sig Start Date End Date Taking? Authorizing Provider  dicyclomine (BENTYL) 20 MG tablet Take 1 tablet (20 mg total) by mouth 2 (two) times daily. 06/13/23  Yes Theresia Lo, [place] K, DO  magnesium hydroxide (MILK OF MAGNESIA) 400 MG/5ML suspension Take 15 mLs by mouth daily as needed for mild constipation. 06/13/23  Yes Theresia Lo, [place] K, DO  polyethylene glycol (MIRALAX) 17 g packet Take 17 g by mouth daily. 06/13/23  Yes Theresia Lo, Anselmo Reihl K, DO  psyllium (METAMUCIL SMOOTH TEXTURE) 58.6 % powder Take 1 packet by mouth 3 (three) times daily. 06/13/23  Yes Theresia Lo, [place] K, DO  senna-docusate (SENOKOT-S) 8.6-50 MG tablet Take 1 tablet by mouth daily. 06/13/23  Yes Theresia Lo, [place] K, DO  sodium phosphate (FLEET) ENEM Place 133 mLs (1 enema total) rectally once  for 1 dose. 06/13/23 06/13/23 Yes Theresia Lo, Benetta Spar K, DO  amLODipine (NORVASC) 10 MG tablet Take 1 tablet (10 mg total) by mouth daily. For BP 10/31/19   O'Neal, Ronnald Ramp, MD  atorvastatin (LIPITOR) 20 MG tablet Take 20 mg by mouth daily.    [provider]  lisinopril (ZESTRIL) 20 MG tablet Take 20 mg by mouth daily.    [provider]  metFORMIN (GLUCOPHAGE) 500 MG tablet Take 1 tablet (500 mg total) by mouth 2 (two) times daily with a meal. Start on 06/28/2018 06/26/18   Shon Hale, MD  rivaroxaban (XARELTO) 20 MG TABS tablet Take 1 tablet (20 mg total) by mouth daily with supper. 07/04/21   O'Neal, Ronnald Ramp, MD      Allergies    Patient has no known allergies.    Review of Systems   Review of Systems  Gastrointestinal:  Positive for constipation.    Physical Exam Updated Vital Signs BP (!) 159/85 (BP Location: Right Arm)   Pulse 85   Temp 98.2 F (36.8 C) (Oral)   Resp 20   Ht 6\' 4"  (1.93 m)   Wt 131.5 kg   SpO2 98%   BMI 35.30 kg/m  Physical Exam Vitals and nursing note reviewed.  Constitutional:      General: He is not in acute distress.    Appearance: Normal appearance. He is obese.  HENT:     Head: Normocephalic and atraumatic.  Nose: Nose normal.     Mouth/Throat:     Mouth: Mucous membranes are moist.  Eyes:     Extraocular Movements: Extraocular movements intact.     Conjunctiva/sclera: Conjunctivae normal.  Cardiovascular:     Rate and Rhythm: Normal rate and regular rhythm.     Heart sounds: Normal heart sounds.  Pulmonary:     Effort: Pulmonary effort is normal.     Breath sounds: Normal breath sounds.  Abdominal:     General: Abdomen is flat. Bowel sounds are normal.     Palpations: Abdomen is soft.     Tenderness: There is no abdominal tenderness.  Musculoskeletal:        General: Normal range of motion.     Cervical back: Normal range of motion.  Skin:    General: Skin is warm and dry.  Neurological:      General: No focal deficit present.     Mental Status: He is alert and oriented to person, place, and time.  Psychiatric:        Mood and Affect: Mood normal.        Behavior: Behavior normal.     ED Results / Procedures / Treatments   Labs (all labs ordered are listed, but only abnormal results are displayed) Labs Reviewed - No data to display  EKG None  Radiology No results found.  Procedures Procedures    Medications Ordered in ED Medications  acetaminophen (TYLENOL) tablet 650 mg (has no administration in time range)  dicyclomine (BENTYL) capsule 10 mg (has no administration in time range)    ED Course/ Medical Decision Making/ A&P                                 Medical Decision Making This patient presents to the ED with chief complaint(s) of constipation with pertinent past medical history of hypertension, diabetes, PE on Xarelto which further complicates the presenting complaint. The complaint involves an extensive differential diagnosis and also carries with it a high risk of complications and morbidity.    The differential diagnosis includes constipation, passing gas with normal bowel sounds making obstruction unlikely, no point tenderness and no fevers making intra-abdominal infection unlikely  Additional history obtained: Additional history obtained from spouse Records reviewed Primary Care Documents  ED Course and Reassessment: On patient's arrival he is hemodynamically stable in no acute distress, has no point tenderness with normal bowel sounds.  Patient was given Tylenol and Bentyl for his cramping and will be given a bowel regimen to start at home.  He was recommended close primary care follow-up and was given strict return precautions.  Independent labs interpretation:  N/A  Independent visualization of imaging: - N/A  Consultation: - Consulted or discussed management/test interpretation w/ external professional: N/A  Consideration for admission  or further workup: Patient has no emergent conditions requiring admission or further work-up at this time and is stable for discharge home with primary care follow-up  Social Determinants of health: N/A    Risk OTC drugs. Prescription drug management.          Final Clinical Impression(s) / ED Diagnoses Final diagnoses:  Constipation, unspecified constipation type    Rx / DC Orders ED Discharge Orders          Ordered    dicyclomine (BENTYL) 20 MG tablet  2 times daily        06/13/23 1101    polyethylene  glycol (MIRALAX) 17 g packet  Daily        06/13/23 1101    senna-docusate (SENOKOT-S) 8.6-50 MG tablet  Daily        06/13/23 1101    psyllium (METAMUCIL SMOOTH TEXTURE) 58.6 % powder  3 times daily        06/13/23 1101    sodium phosphate (FLEET) ENEM   Once        06/13/23 1101    magnesium hydroxide (MILK OF MAGNESIA) 400 MG/5ML suspension  Daily PRN        06/13/23 1101              Elayne Snare K, DO 06/13/23 1101

## 2023-06-13 NOTE — ED Triage Notes (Addendum)
Patient arrives with complaints of constipation x3 days. Patient reports increased cramping/abdominal discomfort overnight. He took a liquid laxative twice over night with no relief.  Rates abdominal discomfort a 6/10.

## 2023-06-13 NOTE — Discharge Instructions (Addendum)
You were seen in the emergency department for your constipation.  I have given you a bowel regimen of medications that you should take for your constipation.  Today you can take the Fleet enema as well as the milk of magnesia which should help relieve the constipation so that you can have a bowel movement.  You can then take MiraLAX every day until you are having regular soft bowel movements daily, then can cut the dose in half until you are again having regular soft bowel movements daily and then can use as needed.  You should also take the Senokot daily to help keep your stools soft.  Metamucil is a fiber supplement that you should take daily to help prevent recurrent constipation.  You should make sure that you are drinking plenty of fluids and staying well-hydrated.  You can follow-up with your primary doctor in the next few days to have your symptoms rechecked.  You should return to the emergency department if you are having significantly worsening pain, start having repetitive running, you have fevers or if you have any other new or concerning symptoms.

## 2023-06-13 NOTE — ED Notes (Signed)
Patient given discharge instructions. Questions were answered. Patient verbalized understanding of discharge instructions and care at home. ? ?Discharged with spouse ?
